# Patient Record
Sex: Female | Born: 1955 | Race: White | Hispanic: No | Marital: Married | Smoking: Never smoker
Health system: Southern US, Community
[De-identification: ages and names within clinical notes are randomized; demographics above are authoritative.]

## PROBLEM LIST (undated history)

## (undated) DIAGNOSIS — N200 Calculus of kidney: Secondary | ICD-10-CM

## (undated) DIAGNOSIS — Z5189 Encounter for other specified aftercare: Secondary | ICD-10-CM

## (undated) DIAGNOSIS — N6019 Diffuse cystic mastopathy of unspecified breast: Secondary | ICD-10-CM

## (undated) DIAGNOSIS — S7401XA Injury of sciatic nerve at hip and thigh level, right leg, initial encounter: Secondary | ICD-10-CM

## (undated) DIAGNOSIS — E559 Vitamin D deficiency, unspecified: Secondary | ICD-10-CM

## (undated) HISTORY — DX: Injury of sciatic nerve at hip and thigh level, right leg, initial encounter: S74.01XA

## (undated) HISTORY — DX: Calculus of kidney: N20.0

## (undated) HISTORY — PX: BREAST BIOPSY: SHX20

## (undated) HISTORY — DX: Encounter for other specified aftercare: Z51.89

## (undated) HISTORY — PX: OTHER SURGICAL HISTORY: SHX169

## (undated) HISTORY — DX: Vitamin D deficiency, unspecified: E55.9

## (undated) HISTORY — PX: ORIF FINGER FRACTURE: SHX2122

## (undated) HISTORY — DX: Diffuse cystic mastopathy of unspecified breast: N60.19

---
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
## 1996-11-07 DIAGNOSIS — N6019 Diffuse cystic mastopathy of unspecified breast: Secondary | ICD-10-CM

## 1996-11-07 HISTORY — DX: Diffuse cystic mastopathy of unspecified breast: N60.19

## 1998-02-04 ENCOUNTER — Other Ambulatory Visit: Admission: RE | Admit: 1998-02-04 | Discharge: 1998-02-04 | Payer: Self-pay | Admitting: *Deleted

## 1998-12-23 ENCOUNTER — Encounter: Admission: RE | Admit: 1998-12-23 | Discharge: 1998-12-23 | Payer: Self-pay | Admitting: Family Medicine

## 1998-12-23 ENCOUNTER — Encounter: Payer: Self-pay | Admitting: Family Medicine

## 1999-02-10 ENCOUNTER — Other Ambulatory Visit: Admission: RE | Admit: 1999-02-10 | Discharge: 1999-02-10 | Payer: Self-pay | Admitting: *Deleted

## 1999-05-26 ENCOUNTER — Encounter: Admission: RE | Admit: 1999-05-26 | Discharge: 1999-05-26 | Payer: Self-pay | Admitting: Family Medicine

## 1999-05-26 ENCOUNTER — Encounter: Payer: Self-pay | Admitting: Family Medicine

## 2000-02-11 ENCOUNTER — Other Ambulatory Visit: Admission: RE | Admit: 2000-02-11 | Discharge: 2000-02-11 | Payer: Self-pay | Admitting: *Deleted

## 2000-05-30 ENCOUNTER — Encounter: Payer: Self-pay | Admitting: *Deleted

## 2000-05-30 ENCOUNTER — Encounter: Admission: RE | Admit: 2000-05-30 | Discharge: 2000-05-30 | Payer: Self-pay | Admitting: *Deleted

## 2001-12-13 ENCOUNTER — Encounter: Payer: Self-pay | Admitting: Family Medicine

## 2001-12-13 ENCOUNTER — Encounter: Admission: RE | Admit: 2001-12-13 | Discharge: 2001-12-13 | Payer: Self-pay | Admitting: Family Medicine

## 2002-02-11 ENCOUNTER — Other Ambulatory Visit: Admission: RE | Admit: 2002-02-11 | Discharge: 2002-02-11 | Payer: Self-pay | Admitting: *Deleted

## 2003-02-13 ENCOUNTER — Other Ambulatory Visit: Admission: RE | Admit: 2003-02-13 | Discharge: 2003-02-13 | Payer: Self-pay | Admitting: Obstetrics and Gynecology

## 2003-08-19 ENCOUNTER — Encounter: Admission: RE | Admit: 2003-08-19 | Discharge: 2003-08-19 | Payer: Self-pay | Admitting: *Deleted

## 2004-03-19 ENCOUNTER — Ambulatory Visit: Payer: Self-pay | Admitting: Sports Medicine

## 2004-09-15 ENCOUNTER — Other Ambulatory Visit: Admission: RE | Admit: 2004-09-15 | Discharge: 2004-09-15 | Payer: Self-pay | Admitting: *Deleted

## 2004-09-29 ENCOUNTER — Encounter: Admission: RE | Admit: 2004-09-29 | Discharge: 2004-09-29 | Payer: Self-pay | Admitting: *Deleted

## 2005-10-24 ENCOUNTER — Encounter: Admission: RE | Admit: 2005-10-24 | Discharge: 2005-10-24 | Payer: Self-pay | Admitting: Obstetrics and Gynecology

## 2005-10-27 ENCOUNTER — Other Ambulatory Visit: Admission: RE | Admit: 2005-10-27 | Discharge: 2005-10-27 | Payer: Self-pay | Admitting: Obstetrics and Gynecology

## 2006-03-26 ENCOUNTER — Encounter (INDEPENDENT_AMBULATORY_CARE_PROVIDER_SITE_OTHER): Payer: Self-pay | Admitting: Specialist

## 2006-03-26 ENCOUNTER — Emergency Department (HOSPITAL_COMMUNITY): Admission: EM | Admit: 2006-03-26 | Discharge: 2006-03-26 | Payer: Self-pay | Admitting: Emergency Medicine

## 2006-12-08 ENCOUNTER — Other Ambulatory Visit: Admission: RE | Admit: 2006-12-08 | Discharge: 2006-12-08 | Payer: Self-pay | Admitting: Obstetrics & Gynecology

## 2007-01-02 ENCOUNTER — Encounter: Admission: RE | Admit: 2007-01-02 | Discharge: 2007-01-02 | Payer: Self-pay | Admitting: Family Medicine

## 2007-01-09 ENCOUNTER — Encounter: Admission: RE | Admit: 2007-01-09 | Discharge: 2007-01-09 | Payer: Self-pay | Admitting: Family Medicine

## 2007-07-11 ENCOUNTER — Encounter: Admission: RE | Admit: 2007-07-11 | Discharge: 2007-07-11 | Payer: Self-pay | Admitting: Family Medicine

## 2007-12-18 ENCOUNTER — Other Ambulatory Visit: Admission: RE | Admit: 2007-12-18 | Discharge: 2007-12-18 | Payer: Self-pay | Admitting: Obstetrics and Gynecology

## 2008-01-21 ENCOUNTER — Encounter: Admission: RE | Admit: 2008-01-21 | Discharge: 2008-01-21 | Payer: Self-pay | Admitting: Obstetrics and Gynecology

## 2008-01-21 LAB — HM DEXA SCAN

## 2008-04-23 ENCOUNTER — Ambulatory Visit: Payer: Self-pay | Admitting: Sports Medicine

## 2008-04-23 DIAGNOSIS — M25519 Pain in unspecified shoulder: Secondary | ICD-10-CM | POA: Insufficient documentation

## 2008-04-23 DIAGNOSIS — M25569 Pain in unspecified knee: Secondary | ICD-10-CM

## 2008-09-22 ENCOUNTER — Encounter: Admission: RE | Admit: 2008-09-22 | Discharge: 2008-09-22 | Payer: Self-pay | Admitting: Family Medicine

## 2008-09-24 ENCOUNTER — Ambulatory Visit (HOSPITAL_BASED_OUTPATIENT_CLINIC_OR_DEPARTMENT_OTHER): Admission: RE | Admit: 2008-09-24 | Discharge: 2008-09-24 | Payer: Self-pay | Admitting: Urology

## 2008-12-08 DIAGNOSIS — N200 Calculus of kidney: Secondary | ICD-10-CM

## 2008-12-08 HISTORY — PX: LITHOTRIPSY: SUR834

## 2008-12-08 HISTORY — DX: Calculus of kidney: N20.0

## 2008-12-25 ENCOUNTER — Ambulatory Visit (HOSPITAL_COMMUNITY): Admission: RE | Admit: 2008-12-25 | Discharge: 2008-12-25 | Payer: Self-pay | Admitting: Urology

## 2009-03-31 ENCOUNTER — Encounter: Admission: RE | Admit: 2009-03-31 | Discharge: 2009-03-31 | Payer: Self-pay | Admitting: Obstetrics and Gynecology

## 2009-11-17 ENCOUNTER — Encounter: Admission: RE | Admit: 2009-11-17 | Discharge: 2009-11-17 | Payer: Self-pay | Admitting: Sports Medicine

## 2009-11-17 ENCOUNTER — Ambulatory Visit: Payer: Self-pay | Admitting: Sports Medicine

## 2009-11-17 DIAGNOSIS — M542 Cervicalgia: Secondary | ICD-10-CM

## 2009-11-17 DIAGNOSIS — M479 Spondylosis, unspecified: Secondary | ICD-10-CM | POA: Insufficient documentation

## 2009-12-01 ENCOUNTER — Ambulatory Visit: Payer: Self-pay | Admitting: Sports Medicine

## 2009-12-29 ENCOUNTER — Ambulatory Visit: Payer: Self-pay | Admitting: Sports Medicine

## 2010-02-09 ENCOUNTER — Ambulatory Visit
Admission: RE | Admit: 2010-02-09 | Discharge: 2010-02-09 | Payer: Self-pay | Source: Home / Self Care | Attending: Sports Medicine | Admitting: Sports Medicine

## 2010-02-09 DIAGNOSIS — J019 Acute sinusitis, unspecified: Secondary | ICD-10-CM | POA: Insufficient documentation

## 2010-02-28 ENCOUNTER — Encounter: Payer: Self-pay | Admitting: Family Medicine

## 2010-03-11 NOTE — Assessment & Plan Note (Signed)
Summary: UPPER R SIDED BACK PAIN X 3 WKS   Vital Signs:  Patient profile:   55 year old female Height:      68 inches Weight:      165 pounds BMI:     25.18 BP sitting:   125 / 83  Vitals Entered By: Lillia Pauls CMA (November 17, 2009 2:05 PM)  History of Present Illness: 6 wks ago she was helping daughter moving and hanging up window dressing pain started in upper RT trap personal trainer thought stress f 2 weddings was doing boxing activity and worse saw joel tull w pain in both traps and massage helped immediately and then wore off as soon as she drove 2 .5 wks ago  now seeing There[facility], DC 3 treatements w ART this has helped pain in RT arm but still feels very heavy Pattern is radiation to thumb and index finger  Allergies (verified): No Known Drug Allergies  Physical Exam  General:  Well-developed,well-nourished,in no acute distress; alert,appropriate and cooperative throughout examination Msk:  diminished reflex in RT arm at BR and triceps sensory change C5/6  no weakness in testing C5 to T1  left arm is wnl as are reflexes  neck motion pain w extension slt pain w rt rotation and Rt lat bend flexion only pain at extreme   Impression & Recommendations:  Problem # 1:  NECK PAIN (ICD-723.1)  Her updated medication list for this problem includes:    Diclofenac Sodium 50 Mg Tbec (Diclofenac sodium) .Marland Kitchen... 1 by mouth two times a day with food    Tramadol Hcl 50 Mg Tabs (Tramadol hcl) .Marland Kitchen... 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 1 by mouth two times a day with food 2)  Ambien 10 Mg Tabs (Zolpidem tartrate) .... 1/2 tab qhs 3)  Bupropion Hcl 150 Mg Xr12h-tab (Bupropion hcl) .Marland Kitchen.. 1 by mouth bid 4)  Adderall Xr 10 Mg Xr24h-cap (Amphetamine-dextroamphetamine) .Marland Kitchen.. 1 by mouth qd 5)  Tramadol Hcl 50 Mg Tabs (Tramadol hcl) .Marland Kitchen.. 1 by mouth qid 6)  Gabapentin 300 Mg Caps (Gabapentin) .Marland Kitchen.. 1 by mouth tid  Patient Instructions: 1)  My thoughts are 95% probablility this is a cervical disk most likely at C5/6 2)  Neck 3)  easy motion exercises 4)  easy isometric resistance 3 exercises to back 5)  cont icing if getting relief 6)  posture is important 7)  ART if working this is OK to do but you cannot have increased sxs into arm 8)  medications 9)  tramadol is a non narcotic pain medicine 10)  major side effects are dizziness, constipation, and for some people wakes you up at night 11)  gabapentin is a nerve blocker 12)  major sideffects are drowsiness, feeling drunk, sometimes mood changes or nightmares 13)  tramadol use at least two times a day but up to 4 times 14)  gabapentin start just at night for first 3  days then two times a day 3 days and then three times a day 15)  RTC in about 10 days Prescriptions: GABAPENTIN 300 MG CAPS (GABAPENTIN) 1 by mouth tid  #90 x 3   Entered by:   Enid Baas MD   Authorized by:   Darene Lamer MD   Signed by:   Enid Baas MD on 11/17/2009   Method used:   [facility]        [facility] [address] [phone]* (retail)       [address]       [place], [place]  [address]       Ph: [phone] or [phone]       Fax: [phone]   RxID:   [id number] TRAMADOL HCL 50 MG TABS (TRAMADOL HCL) 1 by mouth qid  #120 x 3   Entered by:   Enid Baas MD   Authorized by:   Darene Lamer MD   Signed by:   Enid Baas MD on 11/17/2009    Method used:   [facility]        [facility] [address] [phone]* (retail)       [address]       [place], [place]  [address]       Ph: [phone] or [phone]       Fax: [phone]   RxID:   [id number]

## 2010-03-11 NOTE — Assessment & Plan Note (Signed)
Summary: F/U,MC   Vital Signs:  Patient profile:   55 year old female Pulse rate:   67 / minute BP sitting:   129 / 79  (right arm)  Vitals Entered By: Rochele Pages RN (December 29, 2009 12:05 PM) CC: f/u rt shoulder pain arm numbness, and finger tingling   CC:  f/u rt shoulder pain arm numbness and and finger tingling.  History of Present Illness: 55 yo female seen last month for neck pain. Pt states exercises have helped a lot over time and is doing well but feels she has plateau some in her improvement the last couple weeks.  XR showed moderate DJD with C5/C6 disc space narrowing and prominent spurring. Taking Gabapentin 300 three times a day. Tramadol for pain prn. Symptoms go down R arm iand cause numbness in the right first three finger from time to time. Still with subjective decre strength in R arm. She has been doing ART with [facility], DC that helps.    No bowel/bladder problems.  Current Medications (verified): 1)  Diclofenac Sodium 50 Mg Tbec (Diclofenac Sodium) .Marland Kitchen.. 1 By Mouth Two Times A Day With Food 2)  Ambien 10 Mg Tabs (Zolpidem Tartrate) .... 1/2 Tab Qhs 3)  Bupropion Hcl 150 Mg Xr12h-Tab (Bupropion Hcl) .Marland Kitchen.. 1 By Mouth Bid 4)  Adderall Xr 10 Mg Xr24h-Cap (Amphetamine-Dextroamphetamine) .Marland Kitchen.. 1 By Mouth Qd 5)  Tramadol Hcl 50 Mg Tabs (Tramadol Hcl) .Marland Kitchen.. 1 By Mouth Qid 6)  Gabapentin 300 Mg Caps (Gabapentin) .Marland Kitchen.. 1 By Mouth Tid  Allergies (verified): No Known Drug Allergies  Physical Exam  General:  Well-developed,well-nourished,in no acute distress; alert,appropriate and cooperative throughout examination Msk:  Upper ext: Reflexes 2+ to Biceps, brachioradialis, and triceps bilaterally.   Sensory intact distally. No thenar or hypothenar atrophy. Strength 5/5 to arm abduction and ER. negative Neer, hawkins and empty can  Neck: No pain through ROM with mild decrease in flexion actively only Neg spurlings No paraspinal spasm to palpation Pulses:   2+ Extremities:  no edema   Impression & Recommendations:  Problem # 1:  NECK PAIN (ICD-723.1) Seems significantly improved since last visit with increase in ROM and strength in right arm.  Gave pt some more exercises to help with ROM and now some strength improvement. Encouraged to see There[facility] Distance if helping.  Continue neurontin Continue tramadol as needed  Pt has voltaren as well told not to mix with other NSAIDs f/u in 6 weeks.   Her updated medication list for this problem includes:    Diclofenac Sodium 50 Mg Tbec (Diclofenac sodium) .Marland Kitchen... 1 by mouth two times a day with food    Tramadol Hcl 50 Mg Tabs (Tramadol hcl) .Marland Kitchen... 1 by mouth qid  Problem # 2:  SHOULDER PAIN, BILATERAL (ICD-719.41) see above.  Much improved, continue exercises and given some more to help with posture.  See again in 6 weeks if needed.  Her updated medication list for this problem includes:    Diclofenac Sodium 50 Mg Tbec (Diclofenac sodium) .Marland Kitchen... 1 by mouth two times a day with food    Tramadol Hcl 50 Mg Tabs (Tramadol hcl) .Marland Kitchen... 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 1 by mouth two times a day with food 2)  Ambien  10 Mg Tabs (Zolpidem tartrate) .... 1/2 tab qhs 3)  Bupropion Hcl 150 Mg Xr12h-tab (Bupropion hcl) .Marland Kitchen.. 1 by mouth bid 4)  Adderall Xr 10 Mg Xr24h-cap (Amphetamine-dextroamphetamine) .Marland Kitchen.. 1 by mouth qd 5)  Tramadol Hcl 50 Mg Tabs (Tramadol hcl) .Marland Kitchen.. 1 by mouth qid 6)  Gabapentin 300 Mg Caps (Gabapentin) .Marland Kitchen.. 1 by mouth tid   Orders Added: 1)  Est. Patient Level IV [16109]

## 2010-03-11 NOTE — Assessment & Plan Note (Signed)
Summary: F/U,MC   Vital Signs:  Patient profile:   55 year old female BP sitting:   136 / 91  Vitals Entered By: Lillia Pauls CMA (February 09, 2010 11:04 AM)  CC:  shoulder follow up.  History of Present Illness: Neck- improved, continuing exercises regularly, feels much stronger shoulder- tingling almost completely gone, improved strength and ROM now when she on the computer may feel tired in her arms but otherwise good.   Taking gabapentin 2 tabs three times a day  and two times a day for tramadol.  WOuld like to stop tramadol. stopped vytorin 10 days ago.   Back pain- started last night, concerned becasue several previous kidney stones.  no pain with urination, no blood in urine. However, remembers lifting a heavy log yesterday.  Has not tried anything for this pain.  cold symptoms- taking sudafed and mucinex x 10 days. congestion and cough, productive, post nasal drip.  Headache is gone.  no fevers.  yellow mucous.  double sickening, was better last friday now worse again  Current Medications (verified): 1)  Diclofenac Sodium 50 Mg Tbec (Diclofenac Sodium) .Marland Kitchen.. 1 By Mouth Two Times A Day With Food 2)  Ambien 10 Mg Tabs (Zolpidem Tartrate) .... 1/2 Tab Qhs 3)  Bupropion Hcl 150 Mg Xr12h-Tab (Bupropion Hcl) .Marland Kitchen.. 1 By Mouth Bid 4)  Adderall Xr 10 Mg Xr24h-Cap (Amphetamine-Dextroamphetamine) .Marland Kitchen.. 1 By Mouth Qd 5)  Tramadol Hcl 50 Mg Tabs (Tramadol Hcl) .Marland Kitchen.. 1 By Mouth Qid 6)  Gabapentin 300 Mg Caps (Gabapentin) .Marland Kitchen.. 1 By Mouth Tid  Allergies (verified): No Known Drug Allergies  Review of Systems       The patient complains of prolonged cough.  The patient denies chest pain, syncope, and headaches.    Physical Exam  General:  NAD, well-developed, well-nourished, and well-hydrated.   Nose:  no external erythema and nasal dischargemucosal pallor.  no sins tenderness.   turbinate swelling and erythema bilaterally Mouth:  clear oropharynx. Lungs:  rhonchi bilaterally no  crackles Msk:  Upper ext: Sensory intact distally. No thenar or hypothenar atrophy. Strength 5/5 to arm abduction and ER. 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  Problem # 2:  SHOULDER PAIN, BILATERAL (ICD-719.41) Assessment: Improved no pain currently.  will start to wean tramadol and gabapentin as directed in pt instructions.  RTC in 2-3 months  Her updated medication list for this problem includes:    Diclofenac Sodium 50 Mg Tbec (Diclofenac sodium) .Marland Kitchen... 1 by mouth two times a day with food    Tramadol Hcl 50 Mg Tabs (Tramadol hcl) .Marland Kitchen... 1 by mouth q day  Problem # 3:  SINUSITIS, ACUTE (ICD-461.9) Assessment: New gave amoxicillin.  advised pt to return to PCP if not improved with this mdication. Her updated medication list for this problem includes:    Amoxicillin 500 Mg Caps (Amoxicillin) .Marland Kitchen... Take three times a day for 10 days  Complete Medication List: 1)  Diclofenac Sodium 50 Mg Tbec (Diclofenac sodium) .Marland Kitchen.. 1 by mouth two times a day with food 2)  Ambien 10 Mg Tabs (Zolpidem tartrate) .... 1/2 tab qhs 3)  Bupropion Hcl 150 Mg Xr12h-tab (Bupropion hcl) .Marland Kitchen.. 1 by mouth bid 4)  Adderall Xr 10 Mg Xr24h-cap (Amphetamine-dextroamphetamine) .Marland Kitchen.. 1 by mouth qd 5)  Tramadol Hcl 50 Mg Tabs (Tramadol hcl) .Marland Kitchen.. 1 by mouth q day 6)  Gabapentin 300 Mg Caps (Gabapentin) .... Take as directed 7)  Amoxicillin 500 Mg Caps (Amoxicillin) .... Take three times a day for 10 days  Patient Instructions: 1)  Cut tramadol to once daily 2)  OK to use diclofenac as neede 3)  After 1 week start weaining gabapentin as follows: 4)  600/300 /600 5 days 5)  600/ 600 5 days 6)  300/ 600 7 days 7)  600 at night 7 days 8)  then cut to 300 at night and leave  it until recheck 9)  OK to drop tramadol at this point if no pain Prescriptions: AMOXICILLIN 500 MG CAPS (AMOXICILLIN) take three times a day for 10 days  #30 x 0   Entered and Authorized by:   Enid Baas MD   Signed by:   Enid Baas MD on 02/09/2010   Method used:   [facility] to        [facility] [address] [phone]* (retail)       [address]       [place], [place]  [address]       Ph: [phone] or [phone]       Fax: [phone]   RxID:   [id number] DICLOFENAC SODIUM 50 MG TBEC (DICLOFENAC SODIUM) 1 by mouth two times a day with food  #60 x 3   Entered and Authorized by:   Enid Baas MD   Signed by:   Enid Baas MD on 02/09/2010   Method used:   [facility] to        [facility] [address] [phone]* (retail)       [address]       [place], [place]  [address]       Ph: [phone] or [phone]       Fax: [phone]   RxID:   [id number] GABAPENTIN 300 MG CAPS (GABAPENTIN) take as directed  #90 x 3   Entered and Authorized by:   Enid Baas MD   Signed by:   Enid Baas MD on 02/09/2010   Method used:   Electronically to        [facility] [address] [phone]* (retail)       [address]       [place], [place]  [address]       Ph: [phone] or [phone]       Fax: [phone]   RxID:   [id number] TRAMADOL HCL 50 MG TABS (TRAMADOL HCL) 1 by mouth q day  #30 x 3   Entered and Authorized by:   Enid Baas MD   Signed by:   Enid Baas MD on 02/09/2010   Method used:   Electronically to        [facility] [address] [phone]* (retail)       [address]       [place], [place]  [address]       Ph: [phone] or [phone]       Fax: [phone]   RxID:   [id number]    Orders Added: 1)  Est. Patient Level IV [90240]

## 2010-03-11 NOTE — Assessment & Plan Note (Signed)
Summary: F/U,MC   Vital Signs:  Patient profile:   55 year old female Pulse rate:   77 / minute BP sitting:   127 / 92  Vitals Entered By: Rochele Pages RN (December 01, 2009 11:59 AM) CC: f/u rt shoulder pain, rt arm numbness and tingling   CC:  f/u rt shoulder pain and rt arm numbness and tingling.  History of Present Illness: 55 yo female seen earlier this month for neck pain.  XR showed moderate DJD with C5/C6 disc space narrowing and prominent spurring. Taking Gabapentin 300 three times a day. Tramadol for pain. Symptoms go down R arm in a c5/c6 distribution, worse with neck flexion and extremes of extension.  This is much improved with the neurontin.  Still with subjective decre strength in R arm. Tramadol helps pain significantly. She has been doing ART with There[facility] Distance, DC that helps.    No bowel/bladder problems.  Current Medications (verified): 1)  Diclofenac Sodium 50 Mg Tbec (Diclofenac Sodium) .Marland Kitchen.. 1 By Mouth Two Times A Day With Food 2)  Ambien 10 Mg Tabs (Zolpidem Tartrate) .... 1/2 Tab Qhs 3)  Bupropion Hcl 150 Mg Xr12h-Tab (Bupropion Hcl) .Marland Kitchen.. 1 By Mouth Bid 4)  Adderall Xr 10 Mg Xr24h-Cap (Amphetamine-Dextroamphetamine) .Marland Kitchen.. 1 By Mouth Qd 5)  Tramadol Hcl 50 Mg Tabs (Tramadol Hcl) .Marland Kitchen.. 1 By Mouth Qid 6)  Gabapentin 300 Mg Caps (Gabapentin) .Marland Kitchen.. 1 By Mouth Tid  Allergies (verified): No Known Drug Allergies  Review of Systems       See HPI  Physical Exam  General:  Well-developed,well-nourished,in no acute distress; alert,appropriate and cooperative throughout examination Msk:  Upper ext: Reflexes 2+ to Biceps, brachioradialis, and triceps bilaterally.   Sensory intact distally. No thenar or hypothenar atrophy. Strength 5/5 to arm abduction and ER.  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 Range of motion exercises, avoid extreme neck flexion/extension Increase gabapentin to 300, 300, 600mg . Pt may adjust dose to find what works best to a max daily dose of 3,600mg . Continue tramadol as needed for pain. No benefit steroids at this point. RTC 4 weeks.  Complete Medication List: 1)  Diclofenac Sodium 50 Mg Tbec (Diclofenac sodium) .Marland Kitchen.. 1 by mouth two times a day with food 2)  Ambien 10 Mg Tabs (Zolpidem tartrate) .... 1/2 tab qhs 3)  Bupropion Hcl 150 Mg Xr12h-tab (Bupropion hcl) .Marland Kitchen.. 1 by mouth bid 4)  Adderall Xr 10 Mg Xr24h-cap (Amphetamine-dextroamphetamine) .Marland Kitchen.. 1 by mouth qd 5)  Tramadol Hcl 50 Mg Tabs (Tramadol hcl) .Marland Kitchen.. 1 by mouth qid 6)  Gabapentin 300 Mg Caps (Gabapentin) .Marland Kitchen.. 1 by mouth tid  Patient Instructions: 1)  Great to meet you today, 2)  Glad to see that you are progressing so well. 3)  Do range of motion exercises as we discussed, avoide extreme extending of your neck. 4)  Increase gabapentin (neurontin) to 300, 300, 600mg .  May play with the dose yourself to find what works best for you, the max daily dose is 3,600mg . 5)  Continue your tramadol as needed for pain. 6)  Come back to see [place] in 4 weeks. 7)  Enjoy the wedding and be sure to relax (if thats even possible during a wedding). 8)  -Dr. Benjamin Stain   Orders Added: 1)  Est. Patient Level III [91478]

## 2010-04-05 ENCOUNTER — Other Ambulatory Visit: Payer: Self-pay | Admitting: Obstetrics & Gynecology

## 2010-04-05 DIAGNOSIS — Z1231 Encounter for screening mammogram for malignant neoplasm of breast: Secondary | ICD-10-CM

## 2010-04-07 ENCOUNTER — Ambulatory Visit
Admission: RE | Admit: 2010-04-07 | Discharge: 2010-04-07 | Disposition: A | Discharge status: Home / Self Care | Payer: BC Managed Care – HMO | Source: Ambulatory Visit | Attending: Obstetrics & Gynecology | Admitting: Obstetrics & Gynecology

## 2010-04-07 DIAGNOSIS — Z1231 Encounter for screening mammogram for malignant neoplasm of breast: Secondary | ICD-10-CM

## 2010-05-10 ENCOUNTER — Ambulatory Visit (INDEPENDENT_AMBULATORY_CARE_PROVIDER_SITE_OTHER): Payer: BC Managed Care – HMO | Admitting: Sports Medicine

## 2010-05-10 DIAGNOSIS — M479 Spondylosis, unspecified: Secondary | ICD-10-CM

## 2010-05-10 NOTE — Patient Instructions (Signed)
Follow up as needed  I am glad that you are doing better. Give the trainer a call to get set up for an exercise program  It was great to meet you today!

## 2010-05-11 NOTE — Progress Notes (Signed)
  Subjective:    Patient ID: April Silva, female    DOB: [birth year], 55 y.o.   MRN: [id number]  HPI 55 yo female seen multiple times for right arm numbness and tingling and pain related to moderate DJD with C5/C6 disc space narrowing and prominent spurring. Pain and numbness have completely resolved, patient denies weakness. No longer taking Gabapentin or Tramadol. Occasionally taking Voltaren for knee pain. Symptoms radiated down arm in c5/c6 distribution, and were worse with neck flexion and extremes of extension.    Review of Systems As per HPI     Objective:   Physical Exam Msk:  Upper ext: Reflexes 2+ to Biceps, brachioradialis, and triceps bilaterally.   Sensory intact distally. No thenar or hypothenar atrophy. Strength 5/5 to arm abduction and ER. Finger adduction 5/5   Neck: No pain through ROM Neg spurlings No paraspinal spasm to palpation       Assessment & Plan:

## 2010-05-11 NOTE — Assessment & Plan Note (Addendum)
Neuropathy appears to have resolved. Advised to follow up as needed. OK to start exercise program with trainer

## 2010-05-12 LAB — COMPREHENSIVE METABOLIC PANEL
ALT: 39 U/L — ABNORMAL HIGH (ref 0–35)
AST: 25 U/L (ref 0–37)
Albumin: 4.2 g/dL (ref 3.5–5.2)
Alkaline Phosphatase: 88 U/L (ref 39–117)
BUN: 13 mg/dL (ref 6–23)
GFR calc non Af Amer: >60 mL/min (ref >60)
Glucose, Bld: 104 mg/dL — ABNORMAL HIGH (ref 70–99)
Sodium: 142 mEq/L (ref 135–145)
Total Bilirubin: 1 mg/dL (ref 0.3–1.2)
Total Protein: 7.2 g/dL (ref 6.0–8.3)

## 2010-05-12 LAB — CBC
Hemoglobin: 13.6 g/dL (ref 12.0–15.0)
MCV: 88.5 fL (ref 78.0–100.0)

## 2010-05-15 LAB — BASIC METABOLIC PANEL
BUN: 13 mg/dL (ref 6–23)
Creatinine, Ser: 1.37 mg/dL — ABNORMAL HIGH (ref 0.4–1.2)
GFR calc Af Amer: 49 mL/min — ABNORMAL LOW (ref >60)
Potassium: 4 mEq/L (ref 3.5–5.1)
Sodium: 142 mEq/L (ref 135–145)

## 2010-05-15 LAB — URINALYSIS, ROUTINE W REFLEX MICROSCOPIC
Bilirubin Urine: NEGATIVE
Glucose, UA: NEGATIVE mg/dL
Hgb urine dipstick: NEGATIVE
Ketones, ur: NEGATIVE mg/dL
Specific Gravity, Urine: 1.019 (ref 1.005–1.030)

## 2010-05-15 LAB — CBC: Hemoglobin: 11.3 g/dL — ABNORMAL LOW (ref 12.0–15.0)

## 2010-05-15 LAB — URINE MICROSCOPIC-ADD ON

## 2010-05-26 ENCOUNTER — Other Ambulatory Visit: Payer: Self-pay | Admitting: Sports Medicine

## 2010-06-22 NOTE — Op Note (Signed)
NAMEABENI, FINCHUM                 ACCOUNT NO.:  [id number]   MEDICAL RECORD NO.:  [id number]          PATIENT TYPE:  AMB   LOCATION:  [facility]                         FACILITY:  [facility]   PHYSICIAN:  Courtney Paris, M.D.DATE OF BIRTH:  10-22-1955   DATE OF PROCEDURE:  09/24/2008  DATE OF DISCHARGE:                               OPERATIVE REPORT   PREOPERATIVE DIAGNOSIS:  Right distal ureteral calculus with  obstruction.   POSTOPERATIVE DIAGNOSIS:  Right distal ureteral calculus with  obstruction.   OPERATIONS:  Cystoscopy, right retrograde pyelogram, right ureteroscopy  with stone extraction, right ureteral stent insertion.   ANESTHESIA:  General.   SURGEON:  Courtney Paris, M.D.   BRIEF HISTORY:  This 55 year old white female presents with her first  stone.  She had right lower quadrant pain 3-4 days ago.  CT scan showed  a distal right ureteral stone with high-grade hydronephrosis.  KUB shows  the stone still present and the obstruction still present from the  injection of the contrast media from almost 4 days ago.  The stone  measures 6 mm.  She enters now for ureteroscopy and stone extraction.   DESCRIPTION OF PROCEDURE:  The patient was placed on the operating table  in dorsal lithotomy position.  After satisfactory induction of general  anesthesia, was given IV Cipro.  Time-out was then performed and the  patient and procedure were then reconfirmed.  Panendoscope was inserted.  The bladder inspected, found to be free of any mucosal lesions.  Both  orifices looked normal.  A 5 open-ended ureteral catheter was inserted  into the right ureteral orifice and an occlusive retrograde was done.   Retrograde demonstrated still high-grade obstruction with a tortuous  ureter with obstruction down to near the UV junction on the right by the  filling defect which corresponded to the 6 mm stone seen on CT scan.  Under fluoroscopy I was able to bypass the stone with the  guidewire up  to the level of the kidney and get this into the kidney proper.  I  removed the open-ended catheter and then removed the cystoscope.  I  gently dilated the distal ureter with a ureteral access sheath under  fluoroscopy for 5 timed minutes.  This was removed and the 6 French  ureteroscope was then passed into the distal right ureter up to and past  the stone which was entrapped in a stone basket and removed intact.  No  other stones were seen.   I back loaded the guidewire over the cystoscope and inserted a 4.7 x 26  cm length double-J ureteral stent under fluoroscopy.  Removing the  guidewire, there was a nice coil in the renal pelvis and one in the  bladder.  The bladder was drained, B and O suppository given, and some  IV Toradol.  The patient was taken recovery room in good condition to be  later discharged as an outpatient.  I will remove the stent in the  office by cystoscopy next week.      Courtney Paris, M.D.  Electronically Signed  HMK/MEDQ  D:  09/24/2008  T:  09/24/2008  Job:  [id number]

## 2010-06-25 NOTE — Consult Note (Signed)
NAME:  April Silva, April [name]:  [id number]   MEDICAL RECORD NO.:  [id number]          PATIENT TYPE:  EMS   LOCATION:  [facility]                         FACILITY:  [facility]   PHYSICIAN:  Hermelinda Medicus, M.D.   DATE OF BIRTH:  [birth year]   DATE OF CONSULTATION:  DATE OF DISCHARGE:                                 CONSULTATION   This patient is a 55 year old female who [place] the beach and apparently  picked up a small sand bur from her carpet, put it between her lips for  a short time as she was going to do something else and then throw this  in the basket.  However, she aspirated this and has had a very sore  feeling in her larynx.  Her voice was slightly changed.  She had no  airway issues.  This happened at 11 o'clock this morning and she came to  the emergency room approximately 5:30 this evening.  Her voice was  stable, though was a little bit hoarse, but had no airway.  She actually  swallowed a little bit right after that, but she said it did not change  the way it felt.  She felt an area of just irritation right at the  midline around the thyroid cartilage.  On examination in the emergency  room, on indirect exam after using Cetacaine anesthesia indirect  laryngoscopy, we could see the sand spur sticking in the laryngeal  surface of the epiglottis, right just superior to the anterior  commissure.  The airway was not impaired.  I could see down her trachea.  Both pure forms were clear.  The swelling was minimal and, therefore, we  decided considering there would be no way we could find this if it moved  into her bronchus.  We would immediately take her to the operating room  and try to remove this.   PAST MEDICAL HISTORY:  NO ALLERGIES MEDICATIONS.   She takes:  1. Wellbutrin 150 twice a day.  2. Adderall XR 10   PAST SURGICAL HISTORY:  1. She walked through a plate glass window, had a sciatic nerve      laceration on the right side and had an abdominal laceration  on the      left when she was 55 years old.  2. She also has had C-sections x3.   She, otherwise, is in excellent health with no diabetes, TB, heart  trouble, blood pressure issues or respiratory issues.   PHYSICAL EXAMINATION:  VITAL SIGNS:  Blood pressure 167/96.  Pulse 84.  Her O2 was 100.  Respirations 16.  HEENT:  Her ears are clear.  Oral cavity is completely clear.  Her nose  is clear of any inflammation.  The oral cavity is clear.  The larynx  shows the bur sitting in the laryngeal surface of the epiglottis at the  anterior commissure.  Two vocal cords move well.  There is a slight  amount of erythema.  The trachea, that I could see on indirect  laryngoscopy, is clear of any lesion.  The pure forms are clear.  The  ventricles are clear.  The lateral pharyngeal walls are completely  clear.  The neck is free of any thyromegaly, cervical adenopathy or  mass.  No salivary gland abnormalities.  The teeth and gums are  unremarkable.  No thyromegaly, cervical adenopathy or mass.  CHEST:  Clear.  No rales, rhonchi or wheezes.  CARDIOVASCULAR:  No murmurs, rubs or gallops.  ABDOMEN:  Unremarkable, except for the previous injuries.  EXTREMITIES:  Unremarkable, again except for the previous injuries.   INITIAL DIAGNOSES:  1. A sand spur in the supraglottic larynx, anterior commissure      attached to the epiglottis laryngeal surface.  2. Laceration of right sciatic nerve and left abdomen secondary to      walking through a plate glass window.  3. History of C-sections.  4. History of using Wellbutrin 150 twice a day and Adderall 10.           ______________________________  Hermelinda Medicus, M.D.     JC/MEDQ  D:  03/26/2006  T:  03/27/2006  Job:  [id number]   cc:   Kristine Garbe. Ezzard Standing, M.D.

## 2010-06-25 NOTE — Op Note (Signed)
[name] Kitchen  April, [name]:  [id number]   MEDICAL RECORD NO.:  [id number]          PATIENT TYPE:  EMS   LOCATION:  [facility]                         FACILITY:  [facility]   PHYSICIAN:  Hermelinda Medicus, M.D.   DATE OF BIRTH:  [birth year]   DATE OF PROCEDURE:  DATE OF DISCHARGE:                               OPERATIVE REPORT   PREOPERATIVE DIAGNOSIS:  A sand spur in the supraglottic larynx at the  anterior commissioner wedged on the laryngeal surface of the epiglottis.   POSTOPERATIVE DIAGNOSIS:  A sand spur in the supraglottic larynx at the  anterior commissioner wedged on the laryngeal surface of the epiglottis.   PROCEDURE PERFORMED:  Direct laryngoscopy and removal of foreign body,  the sand spur.   ANESTHESIA:  Guadalupe Maple, M.D.   SURGEON:  Hermelinda Medicus, M.D.   INFORMED CONSENT:  The patient is aware of the risks and the plan and  the gains.   DESCRIPTION OF PROCEDURE:  The patient was placed in the supine position  and, under general mask anesthesia and in an effort not to take any  chances on displacing this after she was under mask anesthesia and well  oxygenated, we used an anterior commissioner laryngoscope and the small  laryngeal biopsy forceps and were able to carefully move the scope in to  see the anterior commissioner region and were able to remove this  without displacing this.  Of greatest concern was, of course, the  possibility of its moving and having great difficulty in finding it in a  bronchus.  But we were able to remove this in toto and the laryngeal  surface of the epiglottis looked to be in excellent condition.  There  was no evidence of any edema or bleeding.  There was no blood loss, and  the true vocal cords also looked to be in good condition.  The  subglottic region looked excellent.  The pyriforms and the ventricles  all looked clear.  The scope was then removed.  The patient was  continued on her mask and then was awakened and  tolerated the procedure  very well and is doing well postoperatively.   Follow-up will be in two days.  She will be given Decadron 10 and  continue on antibiotics using a cephalosporin and will be followed,  then, in 10 days and in three weeks.  She will be kept as an outpatient.           ______________________________  Hermelinda Medicus, M.D.     JC/MEDQ  D:  03/26/2006  T:  03/27/2006  Job:  [id number]   cc:   Doug Sou, M.D.  Kristine Garbe. Ezzard Standing, M.D.

## 2010-09-13 ENCOUNTER — Ambulatory Visit (INDEPENDENT_AMBULATORY_CARE_PROVIDER_SITE_OTHER): Payer: BC Managed Care – HMO | Admitting: Sports Medicine

## 2010-09-13 ENCOUNTER — Ambulatory Visit: Payer: BC Managed Care – HMO | Admitting: Sports Medicine

## 2010-09-13 VITALS — BP 154/97

## 2010-09-13 DIAGNOSIS — M542 Cervicalgia: Secondary | ICD-10-CM

## 2010-09-13 DIAGNOSIS — M479 Spondylosis, unspecified: Secondary | ICD-10-CM

## 2010-09-13 MED ORDER — TRAMADOL HCL 50 MG PO TABS: 50.0000 mg | ORAL_TABLET | Freq: Four times a day (QID) | ORAL | Status: AC | PRN

## 2010-09-13 MED ORDER — GABAPENTIN 300 MG PO CAPS: 300.0000 mg | ORAL_CAPSULE | Freq: Three times a day (TID) | ORAL | Status: DC

## 2010-09-13 NOTE — Assessment & Plan Note (Signed)
She has a recurrent flare of her cervical disc disease but I think was triggered by overhead activity and trying to scrape windows.  Because of her significant radicular symptoms I think she needs to start back on gabapentin and may need to stay on this at least at a low dose  We will also work with her to use exercises and good posture  Recheck in 4-6 weeks

## 2010-09-13 NOTE — Assessment & Plan Note (Signed)
With her persistent pain I think she should start back on the tramadol  She will use this 2-4 times a day as needed for pain control  She can cut down on her use of Voltaren but can add some odd days if the pain gets too significant

## 2010-09-13 NOTE — Patient Instructions (Signed)
Do standing chest flies Rowing motion at shoulder height and waist level Arm shake outs Range of motion for neck Work on posture Follow up in 1 month  Gabapentin instructions- Take 1 by mouth at bedtime for the first 5 days, then 1 during the day and 1 at bedtime for the next 5 days, and then 2 during the day and 1 at bedtime.  Continue on gabapentin 3 times per day. If gabapentin is working well- you may stop Remus Loffler  If tramadol is working well- you may not need to take the diclofenac.

## 2010-09-13 NOTE — Progress Notes (Signed)
  Subjective:    Patient ID: April Silva, female    DOB: 1956-01-27, 55 y.o.   MRN: [id number]  HPI  Pt presents to clinic for evaluation of neck pain that started soon after last visit in April.  She thinks the pain started due to lifting and moving things around her house.  Now has shooting pain into upper rt arm and elbow pain, feels weaker using thumb and 1st finger for pinch or elbow for activity.  Also, has tingling in 1st finger and thumb.  Feels better if she sleeps on lt side with rt arm in ER.  Has an area of trapezius that she can apply pressure to which relieves neck pain.    Siyah has done very well from her treatment for degenerative cervical disc disease last year and had weaned off all of her medications. For this reason she is only taking Voltaren for the pain and has not found much relief with this.  Review of Systems     Objective:   Physical Exam No acute distress  Standing with 3-4 degrees of head tilted to rt  Rt trapezius 2 cm higher than lt Spasm in rt trapezius with palpation Lateral lean of neck is ok, but tight Posture is good with a few degrees of head forward Extension and flexion good Rt scapular winging, does not increase with abduction and elevation  Good pinch grip on rt  Testing C5-T1 reveals normal strength Full rt elbow motion, with no pain on palpation Empty can negative Hawkins negative Triceps reflex 1+ on rt, 2+ on lt Biceps reflex slightly decreased on rt, compared to lt          Assessment & Plan:

## 2010-09-20 ENCOUNTER — Other Ambulatory Visit: Payer: Self-pay | Admitting: Family Medicine

## 2010-09-20 ENCOUNTER — Ambulatory Visit
Admission: RE | Admit: 2010-09-20 | Discharge: 2010-09-20 | Disposition: A | Discharge status: Home / Self Care | Payer: BC Managed Care – HMO | Source: Ambulatory Visit | Attending: Family Medicine | Admitting: Family Medicine

## 2010-09-20 DIAGNOSIS — W19XXXA Unspecified fall, initial encounter: Secondary | ICD-10-CM

## 2010-09-20 IMAGING — CR DG HAND COMPLETE 3+V*L*
3 series · 3 of 3 positions shown · non-contrast
Comparison: None

CLINICAL DATA: . Left hand injury and pain

LEFT HAND - COMPLETE 3+ VIEW

[x hand pa left]
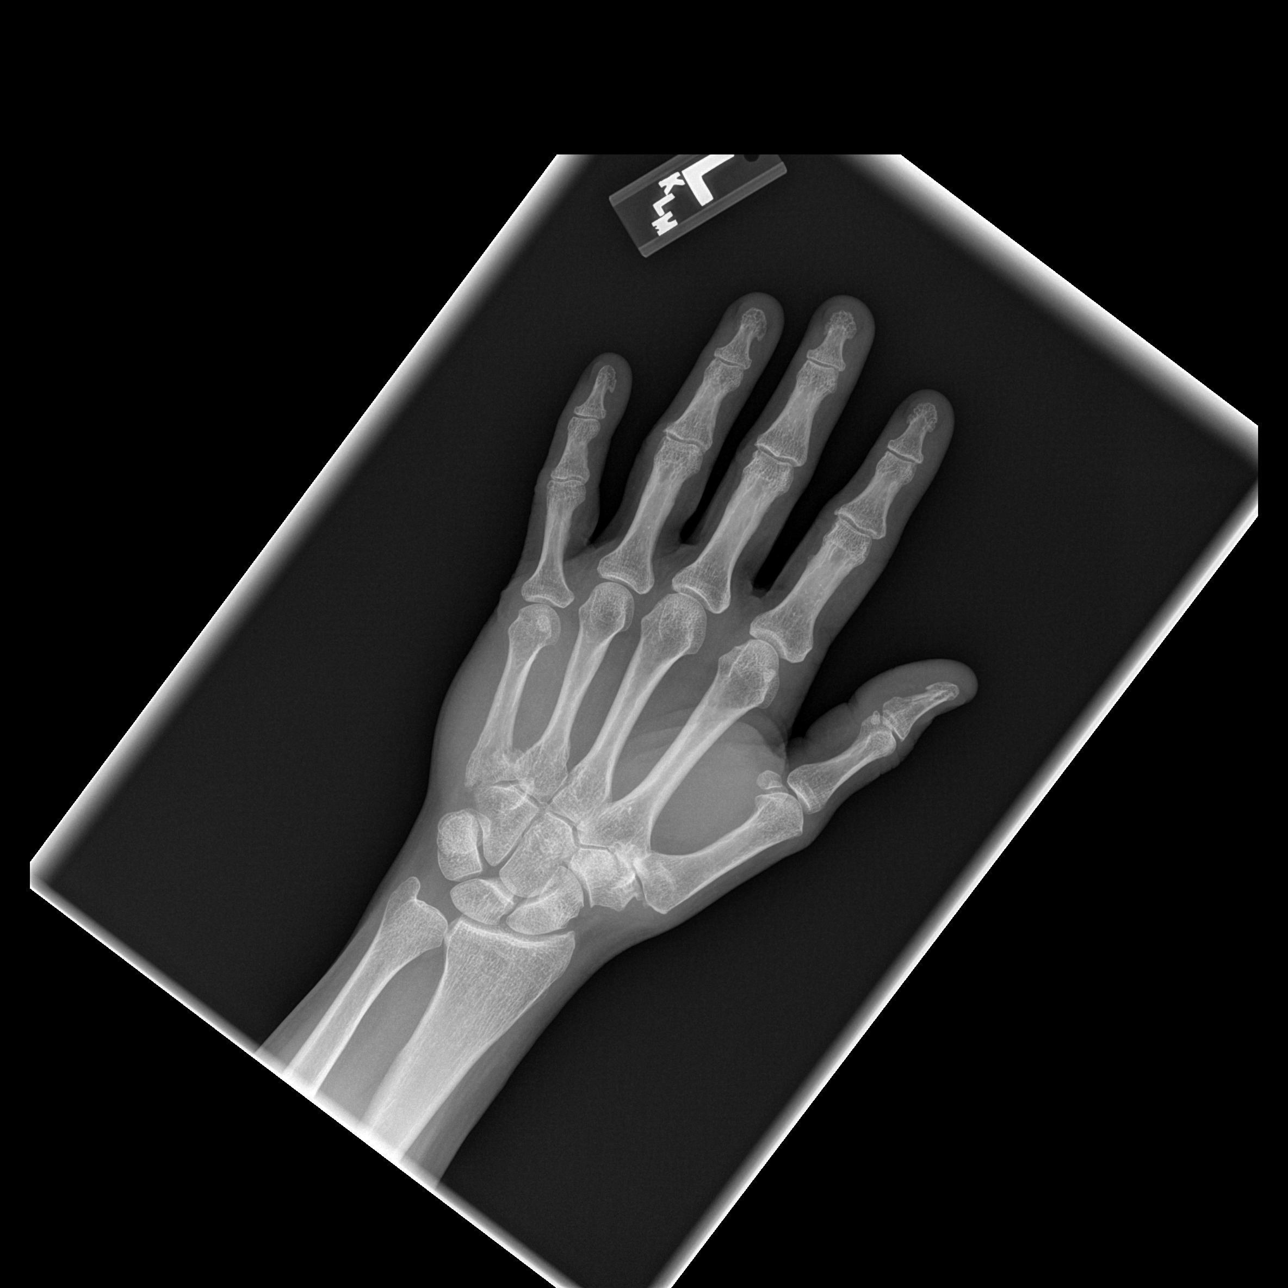

[x hand oblique left]
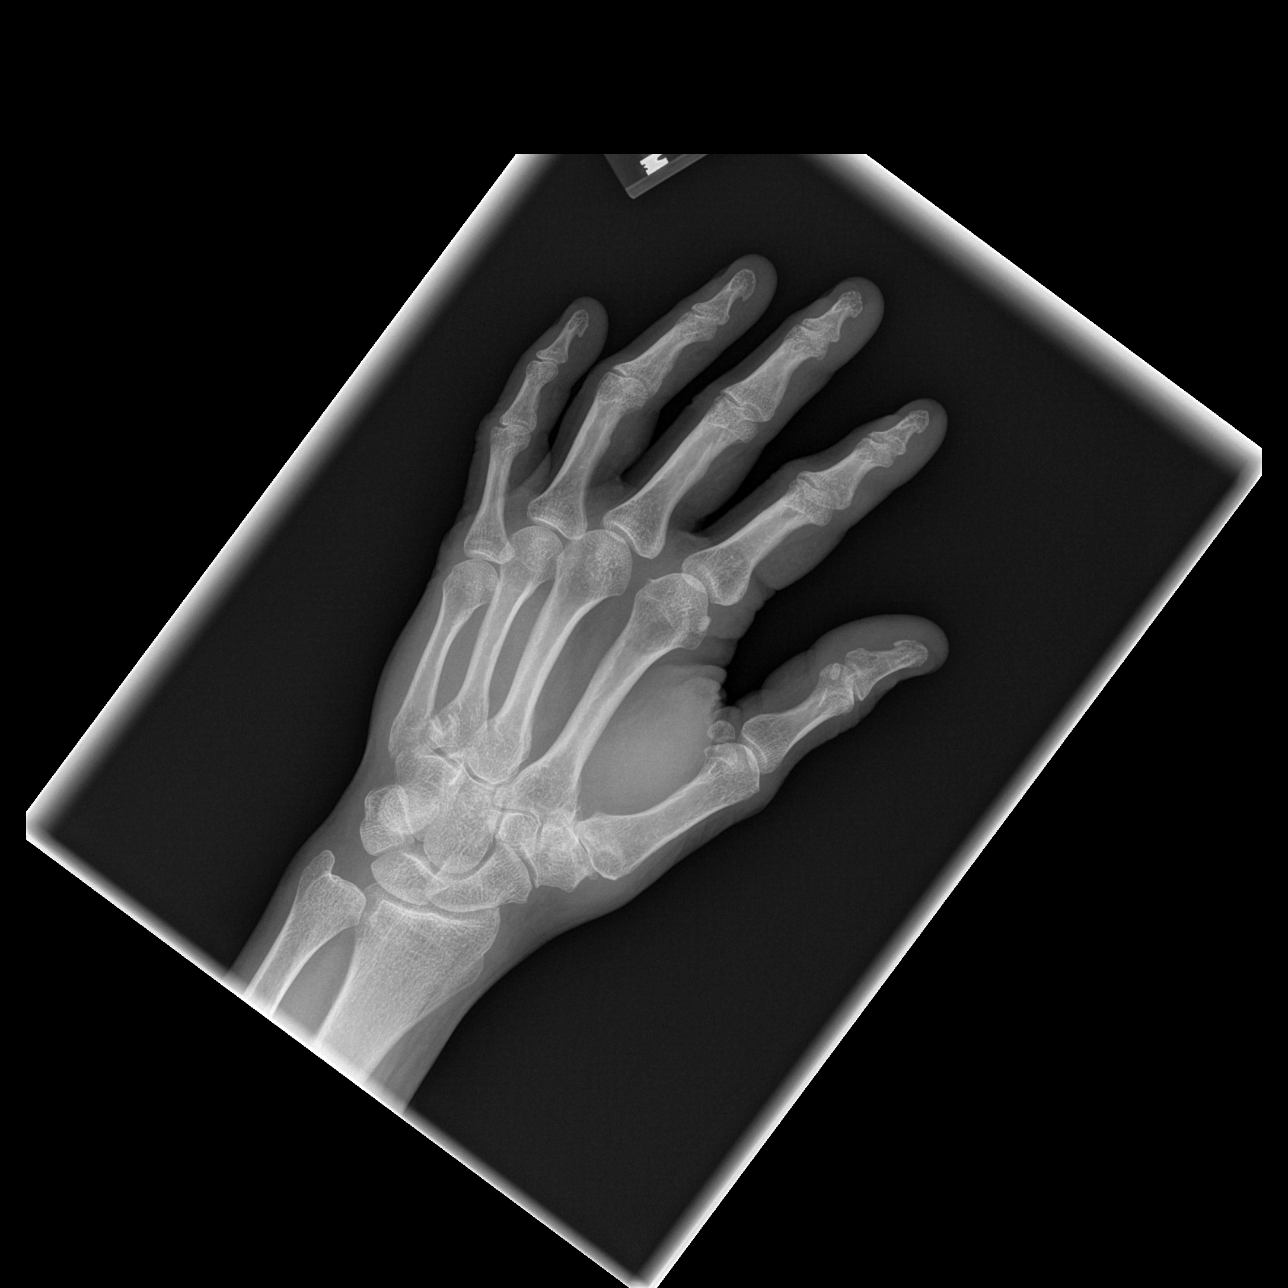

[x hand lat left]
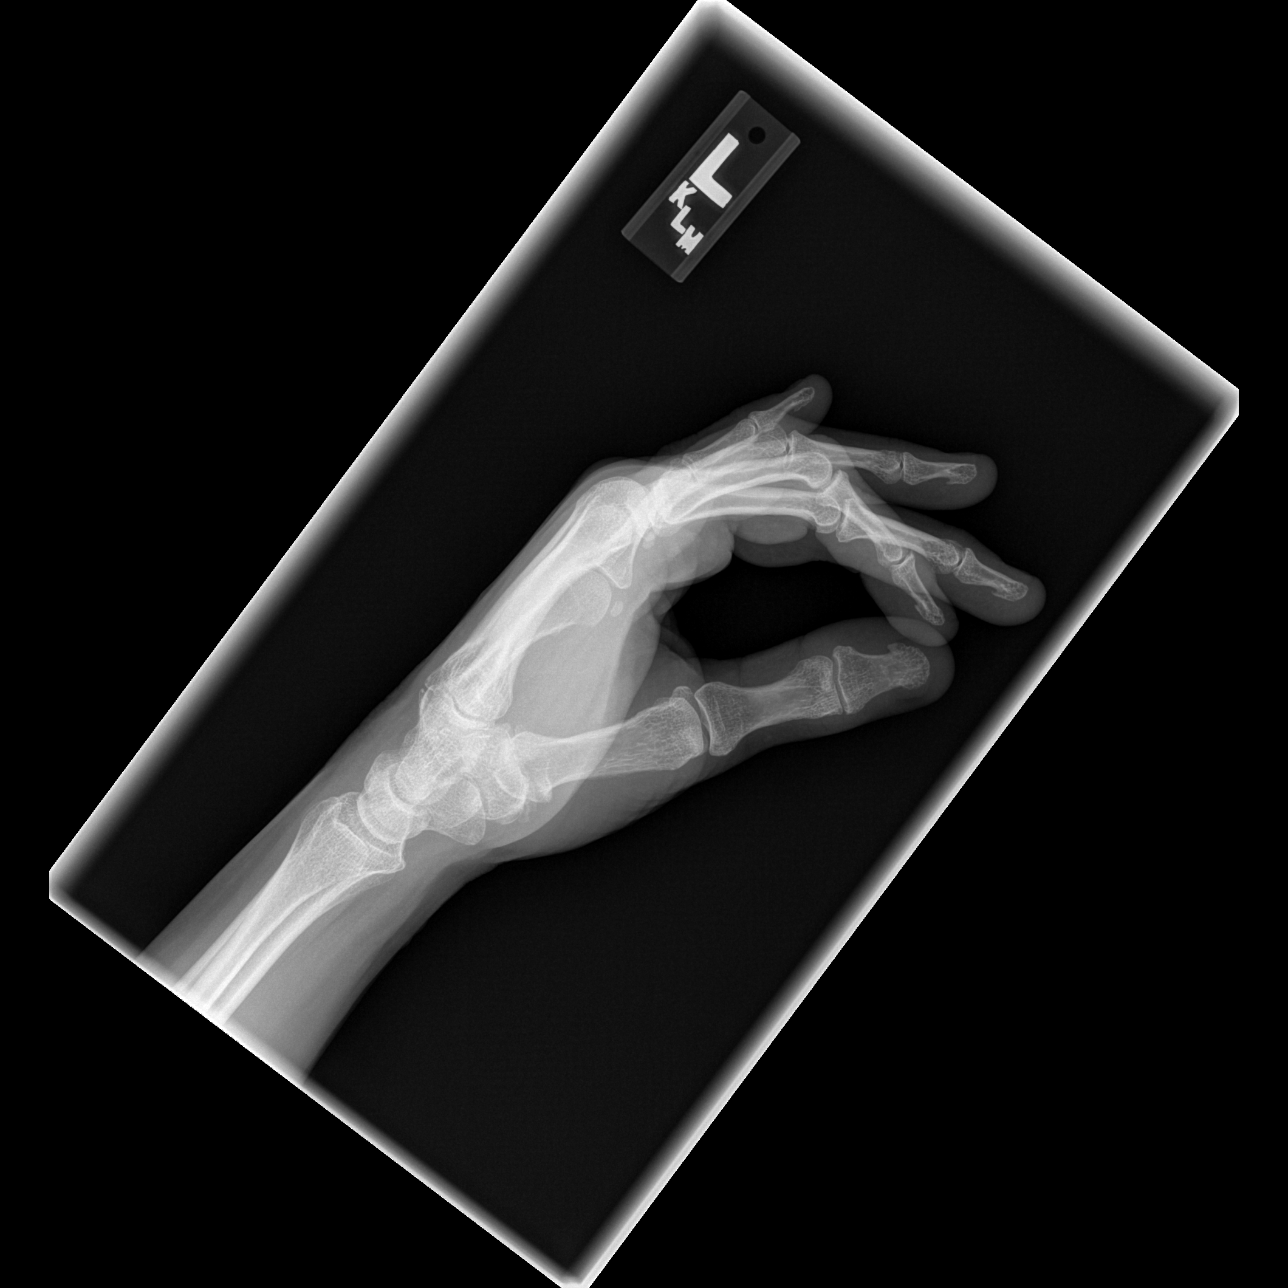

[3 of 3 positions shown; findings below may reference images not displayed]

FINDINGS: An oblique fracture at the base of the fifth metacarpal
is noted with 3-4 mm medial displacement.  This fracture appears to
extend to the carpal/metacarpal joint.
There is no evidence of subluxation or dislocation.
Mild degenerative changes at the first carpometacarpal joint noted.
No other focal bony abnormalities are present.
IMPRESSION: Mildly displaced oblique fracture at the fifth metacarpal base with
possible intra-articular extension.

## 2010-10-12 ENCOUNTER — Ambulatory Visit (INDEPENDENT_AMBULATORY_CARE_PROVIDER_SITE_OTHER): Payer: BC Managed Care – HMO | Admitting: Sports Medicine

## 2010-10-12 ENCOUNTER — Encounter: Payer: Self-pay | Admitting: Sports Medicine

## 2010-10-12 VITALS — BP 127/79 | HR 84 | Ht 68.0 in | Wt 170.0 lb

## 2010-10-12 DIAGNOSIS — M21371 Foot drop, right foot: Secondary | ICD-10-CM | POA: Insufficient documentation

## 2010-10-12 DIAGNOSIS — S62329A Displaced fracture of shaft of unspecified metacarpal bone, initial encounter for closed fracture: Secondary | ICD-10-CM | POA: Insufficient documentation

## 2010-10-12 DIAGNOSIS — M479 Spondylosis, unspecified: Secondary | ICD-10-CM

## 2010-10-12 DIAGNOSIS — M216X9 Other acquired deformities of unspecified foot: Secondary | ICD-10-CM

## 2010-10-12 NOTE — Assessment & Plan Note (Signed)
Appears that she had a transection of the dorsal cutaneous ner br of lat sup peroneal n  Will need to strengthen eversion  Given series of exercises  Also strengthen the post tib funtion to help with better arch and push off Reck 6 wks

## 2010-10-12 NOTE — Patient Instructions (Addendum)
I would like for you to take the gabapentin, 1 in the morning and 2 at bedtime.  Also please take the tramadol three times per day, but do not take after 6pm as it may keep you up at night.   Follow up in 6 weeks.

## 2010-10-12 NOTE — Progress Notes (Signed)
  Subjective:    Patient ID: April Silva, female    DOB: [birth year], 55 y.o.   MRN: [id number]  HPI Here for f/u of neck/shoulder pain.  Mild improvement of pain since last visit.  Did recently fracture 5th metacarpal of L hand so she is having to use her R arm more which is causing her to have more pain.  Describes two types of pain.  First type of pain is in upper back/shoulder along trapezius that is a dull constant ache.  Second type of pain are "electric buzzes" that she feels intermittently down her arm.  Feels like her strength is ok.  Is taking gabapentin but often forgets or misses doses.  Was taking tramadol in the past but currently not taking because she was using oxycodone in from her recent hand surgery.    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  States she ran through a plate glass door as a kid and cut the lower part of her leg.  Since that time has had mild foot drop and difficulty with lifting toes 3-5.   Review of Systems     Objective:   Physical Exam  R Shoulder/Neck: Spasm in rt trapezius with palpation  FROM in neck. Posture is good with a few degrees of head forward  Good pinch grip on rt  Testing C5-T1 reveals normal strength  Full rt elbow motion, with no pain on palpation  Empty can negative  Hawkins negative  Triceps reflex 1+ on rt, 2+ on lt  Biceps reflex slightly decreased on rt, compared to lt  Gait/R leg Difficulty with dorsiflexion of toes 3-5.   Does exhibit mild foot drop with walking, better while in heeled shoes.            Assessment & Plan:

## 2010-10-12 NOTE — Assessment & Plan Note (Signed)
Cont care per hand surgeon

## 2010-10-12 NOTE — Assessment & Plan Note (Signed)
Increase use of tramadol now that off the oxycodone for fx  Use 2 gabapentin at hs and 1 in AM  Work posture and ROM

## 2010-10-14 ENCOUNTER — Ambulatory Visit: Payer: BC Managed Care – HMO | Admitting: Sports Medicine

## 2011-01-03 ENCOUNTER — Other Ambulatory Visit: Payer: Self-pay | Admitting: Sports Medicine

## 2011-01-03 ENCOUNTER — Other Ambulatory Visit: Payer: Self-pay | Admitting: *Deleted

## 2011-01-03 MED ORDER — GABAPENTIN 300 MG PO CAPS: ORAL_CAPSULE | ORAL | Status: DC

## 2011-01-17 ENCOUNTER — Ambulatory Visit (INDEPENDENT_AMBULATORY_CARE_PROVIDER_SITE_OTHER): Payer: BC Managed Care – HMO | Admitting: Sports Medicine

## 2011-01-17 VITALS — BP 120/80

## 2011-01-17 DIAGNOSIS — M479 Spondylosis, unspecified: Secondary | ICD-10-CM

## 2011-01-17 NOTE — Patient Instructions (Signed)
1. Do the stretching exercises that we demonstrated for you when you feel tightness.  2. You can use a tennis ball to release the shoulder muscles with massage.  3. Take your gabapentin in the following manner:       Take one in the am, one at lunch, and 2 at bedtime for one week. Then...      Take one in the am, two at lunch, and 2 at bedtime for one week.  Then...      Take two in the am, two at lunch, and 2 at bedtime until we see you again  4. Follow up in 6 weeks.

## 2011-01-19 NOTE — Progress Notes (Signed)
  Subjective:    Patient ID: April Silva, female    DOB: 1956-01-30, 55 y.o.   MRN: [id number]  HPI 55 y/o female is here to follow up for neck pain and right sided radiculopathy secondary to known cervical spine DJD with impingement.  She is on gabapentin 300 TID and has had an improvement of her symptoms.  She no longer has pain radiating down her arm.  She does have pain in the posterior shoulder bilaterally.  Note this is her second flare of DDD with radiation from her cervical spine over past year   Review of Systems     Objective:   Physical Exam NAD  Tenderness to palpation over the traps and supraspinatus right greater than left No tenderness to palpation of the cervical spine Normal ROM of the cervical spine without pain, she does feel some tightness with lateral rotation Upper extremity strength is normal bilaterally ROM at the shoulder is normal bilaterally Upper extremity sensation is normal bilaterally        Assessment & Plan:

## 2011-01-19 NOTE — Assessment & Plan Note (Addendum)
Her symptoms are improved.  We will titrate to the full dose of gabapentin.  Demonstrated some stretching exercises to help with the spasm she is currently having.  She can also use a tennis ball for massage of problem areas.

## 2011-02-03 ENCOUNTER — Other Ambulatory Visit: Payer: Self-pay | Admitting: *Deleted

## 2011-02-03 MED ORDER — GABAPENTIN 300 MG PO CAPS: 600.0000 mg | ORAL_CAPSULE | Freq: Three times a day (TID) | ORAL | Status: DC

## 2011-02-18 ENCOUNTER — Ambulatory Visit: Payer: BC Managed Care – HMO | Admitting: Family Medicine

## 2011-02-28 ENCOUNTER — Ambulatory Visit: Payer: BC Managed Care – HMO | Admitting: Sports Medicine

## 2011-03-03 ENCOUNTER — Ambulatory Visit (INDEPENDENT_AMBULATORY_CARE_PROVIDER_SITE_OTHER): Payer: BC Managed Care – HMO | Admitting: Sports Medicine

## 2011-03-03 VITALS — BP 126/84

## 2011-03-03 DIAGNOSIS — M479 Spondylosis, unspecified: Secondary | ICD-10-CM

## 2011-03-03 NOTE — Progress Notes (Signed)
  Subjective:    Patient ID: April Silva, female    DOB: 1955/03/12, 56 y.o.   MRN: [id number]  HPI  Pt presents to clinic for f/u of cervical DDD with radicular symptoms. Discouraged because she has not improved as much as she had at last visit. Taking gabapentin 600 mg TID, tramadol once daily. Has a new muscle tightness posterior rt shoulder that goes up into neck and causes severe headaches. Gets numbness in 1-2 fingers on rt, some numbness in this hand.   She does do a fair amount of computer work which may worsen her symptoms. She also has been doing a fair amount of moving some furniture and that clearly does worsen her symptoms.  Continues to see [facility] for ART.    Review of Systems     Objective:   Physical Exam No acute distress  Preserved reflexes bilat elbows Good strength c5-t1 distribution Neck motion- limited on lt, not on rt   Full flexion and extension, lateral bend good bilat of neck trapezius spasm to palpation on rt   After PNF - trap spasm breaks- and gets normal motion to left       Assessment & Plan:

## 2011-03-03 NOTE — Assessment & Plan Note (Signed)
I encouraged her to use a bit more tramadol as I think we have her on enough gabapentin  Start some specific exercises to try to break the trapezius spasm by using some resisted motion of the neck in a left lateral flexion position  Stay with the gabapentin and tramadol for 2 more months  Realize that this is likely to be a chronic conditions we need to find ways to lessen symptoms and prevent flares  Recheck in 2 months

## 2011-03-03 NOTE — Patient Instructions (Addendum)
Increase tramadol 3 times per day for the next 2 weeks- ok to take with gabpentin Ok to decrease to 2 times per day after this, but do take at least 2 tramadol per day  Add shake out exercises for shoulder spasms Lean head to lt- push against it for 3 times for 10 seconds, then pull down against 3 times for 10 seconds-  Take 2-3 deep breaths in between   Please follow up in 2 months  Thank you for seeing [place] today!

## 2011-04-20 ENCOUNTER — Other Ambulatory Visit: Payer: Self-pay | Admitting: Family Medicine

## 2011-04-20 DIAGNOSIS — Z1231 Encounter for screening mammogram for malignant neoplasm of breast: Secondary | ICD-10-CM

## 2011-04-28 ENCOUNTER — Ambulatory Visit
Admission: RE | Admit: 2011-04-28 | Discharge: 2011-04-28 | Disposition: A | Discharge status: Home / Self Care | Payer: BC Managed Care – PPO | Source: Ambulatory Visit | Attending: Family Medicine | Admitting: Family Medicine

## 2011-04-28 DIAGNOSIS — Z1231 Encounter for screening mammogram for malignant neoplasm of breast: Secondary | ICD-10-CM

## 2011-04-30 ENCOUNTER — Other Ambulatory Visit: Payer: Self-pay | Admitting: Sports Medicine

## 2011-05-02 ENCOUNTER — Ambulatory Visit (INDEPENDENT_AMBULATORY_CARE_PROVIDER_SITE_OTHER): Payer: BC Managed Care – PPO | Admitting: Sports Medicine

## 2011-05-02 VITALS — BP 124/82

## 2011-05-02 DIAGNOSIS — F3289 Other specified depressive episodes: Secondary | ICD-10-CM

## 2011-05-02 DIAGNOSIS — F329 Major depressive disorder, single episode, unspecified: Secondary | ICD-10-CM

## 2011-05-02 DIAGNOSIS — M479 Spondylosis, unspecified: Secondary | ICD-10-CM

## 2011-05-02 NOTE — Assessment & Plan Note (Signed)
Since her situation is better and she has had no recent issues with depression, I think it is reasonable to try to wean some medicines  First will try ambien - hoepfully gabapenitn will help sleep  Next would try Adderall because depression may have been factor in those sxs  Would leave wellbutrin as is until others weaned

## 2011-05-02 NOTE — Patient Instructions (Addendum)
For weaning April Silva- do not take the medication every 3rd night for 2 weeks, if this does not make you stay awake - ok to start taking every other day for 1 month, then decrease to only taking every 3 days for 1 month, if you tolerate this decrease ok to discontinue the ambien  Take last dose of gabapentin around 10 pm  Please follow up in 3 months  For April Silva if he wishes - Sleep apnea clinic at [facility] is good, or carmen dohmeier is a Insurance account manager with a sleep apnea clinic  Thank you for seeing [place] today!

## 2011-05-02 NOTE — Progress Notes (Signed)
  Subjective:    Patient ID: April Silva, female    DOB: 03-05-1955, 56 y.o.   MRN: [id number]  HPI  Pt presents to clinic for f/u DJD c-spine with radicular symptoms. She has started going to a posture class at [employer]. Also doing home exercises daily which help break muscle spasms.  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 Taking gabapentin 600 mg tid, tramadol bid. Does not have significant arm tingling unless she does something to stretch the nerve.   Hx of significant depression during time of financial and business stress Saw Dr Jodi Marble then Has been on Addral, well butrin and Ambien since She would like to try to wean off these   Review of Systems     Objective:   Physical Exam Pleasant, normal affect and interaction  Posture good now Full neck flexion, extension, and rotation Full lateral bend Good ER and IR of bilat shoulders Almost equal back scratch bilat Neg empty can bilat Neg hawkins bilat Slight clicking under left shoulder blade with abduction and elevation Strength and neuro testing is normal today      Assessment & Plan:

## 2011-05-02 NOTE — Assessment & Plan Note (Signed)
Clearly doing better  Keep up posture work Keep up exercises as we discussed  We will leave the gabapentin and tramadol as is for next 3 mos

## 2011-08-01 ENCOUNTER — Ambulatory Visit (INDEPENDENT_AMBULATORY_CARE_PROVIDER_SITE_OTHER): Payer: Managed Care, Other (non HMO) | Admitting: Sports Medicine

## 2011-08-01 VITALS — BP 123/79

## 2011-08-01 DIAGNOSIS — F329 Major depressive disorder, single episode, unspecified: Secondary | ICD-10-CM

## 2011-08-01 DIAGNOSIS — M479 Spondylosis, unspecified: Secondary | ICD-10-CM

## 2011-08-01 NOTE — Progress Notes (Signed)
  Subjective:    Patient ID: April Silva, female    DOB: 03/03/1955, 56 y.o.   MRN: [id number]  HPI 56 y/o female with DDD of the cervical spine is here for follow up.  Her symptoms are improving with posture training.  At her last visit the plan was to try to wean some of her medications. She has discontinued the mid day dose of her tramadol and neurontin.  She gets spasm about 3-4 times a week that resolves with stretching and relaxation.    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  She takes 5mg  qhs.   Review of Systems     Objective:   Physical Exam  Neck has good ROM Right trapezius is mildly tender to palpation with palpable spasm Otherwise there is no tenderness to palpation of the neck or shoulders Normal strength and ROM of the upper extremity      Assessment & Plan:

## 2011-08-02 ENCOUNTER — Ambulatory Visit: Payer: BC Managed Care – PPO | Admitting: Sports Medicine

## 2011-08-02 NOTE — Assessment & Plan Note (Signed)
She will not wean any further because she is having symptoms 3-4 times a week.  If her symptoms decrease to once a week we can considering discontinuing the am dose of tramadol and neurontin.  Long term we will plan to keep the pm dose of neurontin to help with pain control and sleep.

## 2011-08-02 NOTE — Assessment & Plan Note (Signed)
Her outlook has improved since her last visit.  She will continue with her ambien for sleep.  She is also taking wellbutrin.  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

## 2011-08-22 ENCOUNTER — Other Ambulatory Visit: Payer: Self-pay | Admitting: Sports Medicine

## 2012-03-07 ENCOUNTER — Ambulatory Visit (INDEPENDENT_AMBULATORY_CARE_PROVIDER_SITE_OTHER): Payer: Managed Care, Other (non HMO) | Admitting: Family Medicine

## 2012-03-07 ENCOUNTER — Encounter: Payer: Self-pay | Admitting: Family Medicine

## 2012-03-07 VITALS — BP 131/86 | HR 80 | Ht 68.0 in | Wt 171.0 lb

## 2012-03-07 DIAGNOSIS — S93699A Other sprain of unspecified foot, initial encounter: Secondary | ICD-10-CM

## 2012-03-07 DIAGNOSIS — M629 Disorder of muscle, unspecified: Secondary | ICD-10-CM

## 2012-03-07 NOTE — Patient Instructions (Addendum)
Thank you for coming in today. You have a plantar fascia rupture.  Use the arch straps and heel cups.  You can ice your heel with a pan of ice water or a frozen styrofoam cup of water.  Let us know if you need pain medicine.  Exercises: Calf raises 15 reps x2 daily Pull on toe massage.   Come back in a few weeks if you are not improving.  Bring your work out shoes.

## 2012-03-07 NOTE — Progress Notes (Signed)
April Silva is a 57 y.o. female who presents to [facility] today for left heel pain. Patient attended an aerobics class for the first time in a long time 2 days ago.  During the class she noted a painful pop in the left plantar heel.  She notes swelling and pain on the plantar medial aspect of the left heel. The pain is worse with standing and activity and better with rest.  She is currently taking diclofenac which helps a bit.  She denies any history of plantar fasciitis.  She denies any radiating pain weakness or numbness.  She feels well otherwise   PMH reviewed.   Significant for cervical degenerative disc disease. History  Substance Use Topics  . Smoking status: Never Smoker   . Smokeless tobacco: Not on file  . Alcohol Use: Not on file   social history: Her elderly aunt infirmed mother is currently living at home with her ROS as above otherwise neg   Exam:  BP 131/86  Pulse 80  Ht 5\' 8"  (1.727 m)  Wt 171 lb (77.565 kg)  BMI 26.00 kg/m2 Gen: Well NAD MSK: Left heel: Swelling over the medial plantar calcaneus.  Tender to palpation of the medial plantar calcaneus.  High appearing arches bilaterally.  Normal foot motion sensation and strength.  Normal posterior tibialis function with toe standing.    Limited musculoskeletal ultrasound of the left heel:  Plantar fascia is present with a large area of hypoechoic change at the calcaneal surface on the medial portion of the plantar fascia insertion.  The fascia is intact distally.  Mildly increased Doppler flow.   Consistent with partial plantar fascia rupture

## 2012-03-07 NOTE — Assessment & Plan Note (Signed)
Noted clinically and on musculoskeletal ultrasound.  Plan: Relative rest, ice, arch straps, heel cushions.  After one or 2 weeks of rest we'll start eccentric heel exercises, massage.  Patient will followup in 3-4 weeks if not improving.

## 2012-03-21 ENCOUNTER — Other Ambulatory Visit: Payer: Self-pay | Admitting: Family Medicine

## 2012-03-21 DIAGNOSIS — Z1231 Encounter for screening mammogram for malignant neoplasm of breast: Secondary | ICD-10-CM

## 2012-03-29 LAB — HM PAP SMEAR: HM Pap smear: NEGATIVE

## 2012-05-02 ENCOUNTER — Ambulatory Visit
Admission: RE | Admit: 2012-05-02 | Discharge: 2012-05-02 | Disposition: A | Discharge status: Home / Self Care | Payer: Managed Care, Other (non HMO) | Source: Ambulatory Visit | Attending: Family Medicine | Admitting: Family Medicine

## 2012-05-02 DIAGNOSIS — Z1231 Encounter for screening mammogram for malignant neoplasm of breast: Secondary | ICD-10-CM

## 2012-07-25 ENCOUNTER — Other Ambulatory Visit: Payer: Managed Care, Other (non HMO) | Admitting: Family Medicine

## 2012-11-01 ENCOUNTER — Ambulatory Visit: Payer: Managed Care, Other (non HMO) | Admitting: Sports Medicine

## 2012-11-05 ENCOUNTER — Ambulatory Visit (INDEPENDENT_AMBULATORY_CARE_PROVIDER_SITE_OTHER): Payer: BC Managed Care – HMO | Admitting: Sports Medicine

## 2012-11-05 VITALS — BP 121/85 | Ht 68.0 in | Wt 173.0 lb

## 2012-11-05 DIAGNOSIS — M25572 Pain in left ankle and joints of left foot: Secondary | ICD-10-CM

## 2012-11-05 DIAGNOSIS — M25579 Pain in unspecified ankle and joints of unspecified foot: Secondary | ICD-10-CM

## 2012-11-05 DIAGNOSIS — M25511 Pain in right shoulder: Secondary | ICD-10-CM

## 2012-11-05 DIAGNOSIS — M25519 Pain in unspecified shoulder: Secondary | ICD-10-CM

## 2012-11-05 NOTE — Progress Notes (Signed)
  Subjective:    Patient ID: April Silva, female    DOB: 1955-03-29, 57 y.o.   MRN: [id number]  HPI chief complaint: Right shoulder and left foot pain  Very pleasant 57 year old female comes in today mainly for a referral to physical therapy. She has a long-standing history of cervical degenerative disc disease and has been treated by Dr. Darrick Penna in the past. She is complaining of pain in the right parascapular region. She is also getting some spasm in this area. Although she was initially getting some numbness and tingling into the right hand initially that has now resolved with past treatment. She does get some mild weakness from time to time in her right arm. In regards to her left foot she has a history of plantar fasciitis which was treated back in January of this year. Today she is experiencing pain along the lateral aspect of her foot. She denies any heel pain. Her pain is localized to the base of the fifth metatarsal but at times she will get pain along the distal fifth metatarsal as well particularly with first standing. She has some orthotics that she has been wearing. No recent trauma.  Past medical history and current medications are reviewed No known allergies    Review of Systems     Objective:   Physical Exam Well-developed, well-nourished. No acute distress. Awake alert and oriented x3.  Full painless cervical range of motion. There is tenderness to palpation along the right parascapular region but no spasm and no obvious trigger point. Neurological exam shows a 1/4 biceps reflex on the right compared to a 2/4 on the left. Brachial radialis and triceps reflexes are both 2/4 bilaterally. No atrophy. Good strength. Left foot: Cavus foot. No tenderness to palpation along the plantar fascia or at the calcaneus. Mild tenderness along the course of the peroneal tendon at its insertion onto the base of the fifth metatarsal. No soft tissue swelling. Mild pain with resisted foot eversion.  Walking without a limp.       Assessment & Plan:  Right parascapular pain likely referred pain from cervical degenerative disc disease Left foot pain secondary to lateral column overload versus peroneal tendinopathy  I agree that the patient would likely benefit from physical therapy. A referral to [facility] was made. I will have him try some iontophoresis to her lateral ankle and she will followup with me in 6 weeks. It is quite possible that her symptoms here are secondary to lateral column overload since she was previously suffering from plantar fasciitis. If her foot is still painful at followup I would consider ultrasound and we may need to tweek her orthotics just a bit. She will call with questions or concerns prior to her followup visit.

## 2012-12-19 ENCOUNTER — Encounter: Payer: Self-pay | Admitting: Sports Medicine

## 2012-12-19 ENCOUNTER — Ambulatory Visit (INDEPENDENT_AMBULATORY_CARE_PROVIDER_SITE_OTHER): Payer: BC Managed Care – HMO | Admitting: Sports Medicine

## 2012-12-19 VITALS — BP 129/84 | Ht 68.0 in | Wt 177.0 lb

## 2012-12-19 DIAGNOSIS — M25511 Pain in right shoulder: Secondary | ICD-10-CM

## 2012-12-19 DIAGNOSIS — M25519 Pain in unspecified shoulder: Secondary | ICD-10-CM

## 2012-12-19 NOTE — Progress Notes (Signed)
  Subjective:    Patient ID: April Silva, female    DOB: 1955/10/24, 57 y.o.   MRN: [id number]  HPI Patient comes in today for followup on posterior right shoulder pain. Still experiencing intermittent discomfort. She has been working with physical therapy as well as a rolfing. Seems to be improving. She denies significant pain, numbness, or tingling in the right arm. Her main concern today is her level of inactivity. She is wanting to discuss nutrition as well as a well rounded exercise routine. She is concerned that she has gained quite a bit of weight and has become more inactive and she thinks that a lot of her aches and pains are due to this.    Review of Systems     Objective:   Physical Exam Well-developed, well-nourished. No acute distress. Weight is 177 pounds  Right shoulder shows full range of motion. There is some parascapular tenderness to palpation. She has good strength in the right upper extremity but has a 1/4 biceps and 1/4 triceps reflex on the right compared to 2-3/4 on the left. No atrophy. Neurovascularly intact distally.       Assessment & Plan:  Right parascapular pain likely referred pain from the cervical spine  I think the patient should continue to work with physical therapy until she is ready to be discharged to a home exercise program. I had a long talk with her regarding nutrition and a proper exercise routine consisting of strengthening, cardio, and stretching. I provided her with a number for Riley Kill (nutritionist) and I would like to see her back in the office in 4-6 weeks.

## 2012-12-19 NOTE — Patient Instructions (Signed)
  Wyona Almas (nutritionist)- [phone]   You need 30-60 minutes of cardio 4 times a week  You need 2 days a week of strength training  You need to stretch twice a week, preferably after your cardio workout  Followup with me in 6 weeks.

## 2013-01-24 ENCOUNTER — Ambulatory Visit: Payer: BC Managed Care – HMO | Admitting: Sports Medicine

## 2013-04-03 ENCOUNTER — Encounter: Payer: Self-pay | Admitting: Nurse Practitioner

## 2013-04-04 ENCOUNTER — Ambulatory Visit: Payer: Self-pay | Admitting: Nurse Practitioner

## 2013-04-12 ENCOUNTER — Encounter: Payer: Self-pay | Admitting: Nurse Practitioner

## 2013-04-12 ENCOUNTER — Ambulatory Visit (INDEPENDENT_AMBULATORY_CARE_PROVIDER_SITE_OTHER): Payer: BC Managed Care – PPO | Admitting: Nurse Practitioner

## 2013-04-12 VITALS — BP 116/66 | HR 66 | Ht 67.0 in | Wt 176.0 lb

## 2013-04-12 DIAGNOSIS — Z780 Asymptomatic menopausal state: Secondary | ICD-10-CM

## 2013-04-12 DIAGNOSIS — Z0000 Encounter for general adult medical examination without abnormal findings: Secondary | ICD-10-CM

## 2013-04-12 DIAGNOSIS — E559 Vitamin D deficiency, unspecified: Secondary | ICD-10-CM

## 2013-04-12 DIAGNOSIS — Z1211 Encounter for screening for malignant neoplasm of colon: Secondary | ICD-10-CM

## 2013-04-12 DIAGNOSIS — Z01419 Encounter for gynecological examination (general) (routine) without abnormal findings: Secondary | ICD-10-CM

## 2013-04-12 MED ORDER — VITAMIN D (ERGOCALCIFEROL) 1.25 MG (50000 UNIT) PO CAPS: 50000.0000 [IU] | ORAL_CAPSULE | ORAL | Status: DC

## 2013-04-12 NOTE — Progress Notes (Signed)
Patient ID: April Silva, female   DOB: 03/02/1955, 58 y.o.   MRN: [id number] 58 y.o. [id number] Married Caucasian Fe here for annual exam.  No new health problems other than pain in mid back that radiates to right shoulder and axilla.   No LMP recorded. Patient is postmenopausal.          Sexually active: yes  The current method of family planning is post menopausal status.    Exercising: yes  Exercise is limited by injuries. Smoker:  no  Health Maintenance: Pap:  03/29/12, WNL, neg HR HPV MMG:  05/02/12, Bi-Rads 1: negative Colonoscopy:  12/2008, normal, recall in 10 years BMD:  12/09 0.2/-1.3 TDaP: 12/22/09 Labs:  HB:  12.8 Urine:  Negative, pH 5.0   reports that she has never smoked. She has never used smokeless tobacco. She reports that she drinks about 3.5 ounces of alcohol per week. She reports that she does not use illicit drugs.  Past Medical History  Diagnosis Date  . Vitamin D deficiency   . Fibrocystic breast 10/98  . Injury of right sciatic nerve 1971    w/laceration of artery and nerve, blood transfusion, fell through plate glass door  . Kidney stone 11/10    stent, lithotripsy  . Blood transfusion without reported diagnosis age 58    lac artery and right sciatic nerve    Past Surgical History  Procedure Laterality Date  . Lithotripsy  12/2008    Current Outpatient Prescriptions  Medication Sig Dispense Refill  . amphetamine-dextroamphetamine (ADDERALL XR) 10 MG 24 hr capsule Take 10 mg by mouth daily.      Marland Kitchen. atorvastatin (LIPITOR) 20 MG tablet Take 20 mg by mouth daily.      Marland Kitchen. buPROPion (WELLBUTRIN SR) 150 MG 12 hr tablet Take 150 mg by mouth 2 (two) times daily.       . cetirizine (ZYRTEC) 10 MG tablet Take 10 mg by mouth daily.      . diclofenac (VOLTAREN) 75 MG EC tablet Take 75 mg by mouth 2 (two) times daily.      . Misc Natural Products (LUTEIN 20 PO) Take by mouth.      . zolpidem (AMBIEN) 10 MG tablet Take 5 mg by mouth At bedtime.      . Vitamin D,  Ergocalciferol, (DRISDOL) 50000 UNITS CAPS Take 50,000 Units by mouth daily.       No current facility-administered medications for this visit.    Family History  Problem Relation Age of Onset  . Heart disease Mother   . Heart disease Father   . Diabetes Father   . Hypertension Father   . Heart attack Father   . Parkinson's disease Father   . Heart disease Maternal Grandmother   . Heart attack Maternal Grandmother   . Multiple births Paternal Aunt     had twins  . Multiple sclerosis Maternal Grandfather   . Breast cancer Paternal Grandmother   . Hypertension Paternal Grandmother   . Hypertension Paternal Grandfather     ROS:  Pertinent items are noted in HPI.  Otherwise, a comprehensive ROS was negative.  Exam:   BP 116/66  Pulse 66  Ht 5\' 7"  (1.702 m)  Wt 176 lb (79.833 kg)  BMI 27.56 kg/m2 Height: 5\' 7"  (170.2 cm)  Ht Readings from Last 3 Encounters:  04/12/13 5\' 7"  (1.702 m)  12/19/12 5\' 8"  (1.727 m)  11/05/12 5\' 8"  (1.727 m)    General appearance: alert, cooperative and appears stated  age Head: Normocephalic, without obvious abnormality, atraumatic Neck: no adenopathy, supple, symmetrical, trachea midline and thyroid normal to inspection and palpation Lungs: clear to auscultation bilaterally Breasts: normal appearance, no masses or tenderness dense without obvious mass. Heart: regular rate and rhythm Abdomen: soft, non-tender; no masses,  no organomegaly Extremities: extremities normal, atraumatic, no cyanosis or edema Skin: Skin color, texture, turgor normal. No rashes or lesions Lymph nodes: Cervical, supraclavicular, and axillary nodes normal. No abnormal inguinal nodes palpated Neurologic: Grossly normal   Pelvic: External genitalia:  no lesions              Urethra:  normal appearing urethra with no masses, tenderness or lesions              Bartholin's and Skene's: normal                 Vagina: normal appearing vagina with normal color and discharge,  no lesions              Cervix: anteverted              Pap taken: no Bimanual Exam:  Uterus:  normal size, contour, position, consistency, mobility, non-tender              Adnexa: no mass, fullness, tenderness               Rectovaginal: Confirms               Anus:  normal sphincter tone, no lesions  A:  Well Woman with normal exam  Postmenopausal  Vit D deficient    P:   Pap smear as per guidelines   Mammogram due 3/15  Order for BMD  Counseled on breast self exam, adequate intake of calcium and vitamin D, diet and exercise return annually or prn  An After Visit Summary was printed and given to the patient.

## 2013-04-12 NOTE — Patient Instructions (Signed)

## 2013-04-13 LAB — VITAMIN D 25 HYDROXY (VIT D DEFICIENCY, FRACTURES): Vit D, 25-Hydroxy: 44 ng/mL (ref 30–89)

## 2013-04-15 LAB — HEMOGLOBIN, FINGERSTICK: Hemoglobin, fingerstick: 12.8 g/dL (ref 12.0–16.0)

## 2013-04-16 ENCOUNTER — Other Ambulatory Visit: Payer: Self-pay

## 2013-04-16 DIAGNOSIS — Z1231 Encounter for screening mammogram for malignant neoplasm of breast: Secondary | ICD-10-CM

## 2013-04-16 NOTE — Progress Notes (Signed)
Encounter reviewed by Dr. Lachlan Pelto Silva.  

## 2013-04-22 LAB — POCT URINALYSIS DIPSTICK
BILIRUBIN UA: NEGATIVE
GLUCOSE UA: NEGATIVE
Ketones, UA: NEGATIVE
Leukocytes, UA: NEGATIVE
Nitrite, UA: NEGATIVE
Protein, UA: NEGATIVE
RBC UA: NEGATIVE
Urobilinogen, UA: NEGATIVE
pH, UA: 5

## 2013-04-22 LAB — FECAL OCCULT BLOOD, IMMUNOCHEMICAL: IMMUNOLOGICAL FECAL OCCULT BLOOD TEST: NEGATIVE

## 2013-05-03 ENCOUNTER — Ambulatory Visit
Admission: RE | Admit: 2013-05-03 | Discharge: 2013-05-03 | Disposition: A | Discharge status: Home / Self Care | Payer: BC Managed Care – PPO | Source: Ambulatory Visit | Attending: Nurse Practitioner | Admitting: Nurse Practitioner

## 2013-05-03 ENCOUNTER — Ambulatory Visit
Admission: RE | Admit: 2013-05-03 | Discharge: 2013-05-03 | Disposition: A | Discharge status: Home / Self Care | Payer: BC Managed Care – PPO | Source: Ambulatory Visit

## 2013-05-03 DIAGNOSIS — Z780 Asymptomatic menopausal state: Secondary | ICD-10-CM

## 2013-05-03 DIAGNOSIS — Z1231 Encounter for screening mammogram for malignant neoplasm of breast: Secondary | ICD-10-CM

## 2013-05-07 ENCOUNTER — Telehealth: Payer: Self-pay | Admitting: *Deleted

## 2013-05-07 ENCOUNTER — Other Ambulatory Visit: Payer: Self-pay | Admitting: Nurse Practitioner

## 2013-05-07 DIAGNOSIS — R928 Other abnormal and inconclusive findings on diagnostic imaging of breast: Secondary | ICD-10-CM

## 2013-05-07 NOTE — Telephone Encounter (Signed)
I have attempted to contact this patient by phone with the following results: left message to return my call on answering machine (home/mobile per DPR).  

## 2013-05-07 NOTE — Telephone Encounter (Signed)
Pt notified in result note.  

## 2013-05-07 NOTE — Telephone Encounter (Signed)
Message copied by Luisa DagoPHILLIPS, Sanae Willetts C on Tue May 07, 2013 11:48 AM ------      Message from: Ria CommentGRUBB, PATRICIA R      Created: Tue May 07, 2013  8:51 AM       Leet patient know that BMD shows a good score at the spine and upper osteopenic range at the left femur at 1.5.  Compared to previous study in 2009 there has been a decrease at the hip by 3.3 %.  This means more time with a walking program such as treadmill stair stepper or bicycle at least 3 -4 times weekly.  Still needs to do upper body weights. Recheck i 2 years. ------

## 2013-05-17 ENCOUNTER — Ambulatory Visit
Admission: RE | Admit: 2013-05-17 | Discharge: 2013-05-17 | Disposition: A | Discharge status: Home / Self Care | Payer: Self-pay | Source: Ambulatory Visit | Attending: Nurse Practitioner | Admitting: Nurse Practitioner

## 2013-05-17 ENCOUNTER — Other Ambulatory Visit: Payer: Self-pay | Admitting: Nurse Practitioner

## 2013-05-17 DIAGNOSIS — R928 Other abnormal and inconclusive findings on diagnostic imaging of breast: Secondary | ICD-10-CM

## 2013-05-17 DIAGNOSIS — R921 Mammographic calcification found on diagnostic imaging of breast: Secondary | ICD-10-CM

## 2013-05-21 ENCOUNTER — Ambulatory Visit
Admission: RE | Admit: 2013-05-21 | Discharge: 2013-05-21 | Disposition: A | Discharge status: Home / Self Care | Payer: BC Managed Care – PPO | Source: Ambulatory Visit | Attending: Nurse Practitioner | Admitting: Nurse Practitioner

## 2013-05-21 DIAGNOSIS — R928 Other abnormal and inconclusive findings on diagnostic imaging of breast: Secondary | ICD-10-CM

## 2013-05-21 DIAGNOSIS — R921 Mammographic calcification found on diagnostic imaging of breast: Secondary | ICD-10-CM

## 2013-05-28 ENCOUNTER — Inpatient Hospital Stay: Admission: RE | Admit: 2013-05-28 | Payer: BC Managed Care – PPO | Source: Ambulatory Visit

## 2013-06-18 ENCOUNTER — Ambulatory Visit (INDEPENDENT_AMBULATORY_CARE_PROVIDER_SITE_OTHER): Payer: BC Managed Care – PPO | Admitting: Sports Medicine

## 2013-06-18 ENCOUNTER — Encounter: Payer: Self-pay | Admitting: Sports Medicine

## 2013-06-18 VITALS — BP 125/89 | Ht 67.0 in | Wt 170.0 lb

## 2013-06-18 DIAGNOSIS — S93699A Other sprain of unspecified foot, initial encounter: Secondary | ICD-10-CM

## 2013-06-18 DIAGNOSIS — M629 Disorder of muscle, unspecified: Secondary | ICD-10-CM

## 2013-06-18 DIAGNOSIS — M25579 Pain in unspecified ankle and joints of unspecified foot: Secondary | ICD-10-CM | POA: Insufficient documentation

## 2013-06-18 NOTE — Assessment & Plan Note (Signed)
Use ankle compresison over left foot to reduce swelling and stablizie per brev isometiric exerc  Place latearl post on orthotics  Cont on diclofenac  Reck pending response

## 2013-06-18 NOTE — Progress Notes (Signed)
Patient ID: Blanchard KelchSally H Swatek, female   DOB: 06/13/1955, 58 y.o.   MRN: [id number]  Patient w hx of Left PF partial teat about 18 mos ago This gradually got better However she feels that she may have favored this with walking on outside of foot RT foot has mild foot drop so she works left harder  Now pain is lateral foot and into forefoot No real swelling Orthotics do help but pain is still there Barefoot is painful  PEXAM NAD BP 125/89  Ht 5\' 7"  (1.702 m)  Wt 170 lb (77.111 kg)  BMI 26.62 kg/m2  Foot has normal structure Left forefoot is 1.2 cms wider than RT Palpable tendernses over MTP plantar surface 4 and 5 Slt pain on squeeze but no nerve radaitons Pain on left at base of 5th  US Bursal type swelling below the insertion of per brevis tendon into base of 5th No sgin of stress fracture Swelling between MT heads of 4 and 5 Nerve does not show neuroma Plantar fascia appears almost back to normal w minimal change at proximal medial aspect

## 2013-06-18 NOTE — Assessment & Plan Note (Signed)
This appears to be healed clincially and almost normal on [place]

## 2013-06-18 NOTE — Patient Instructions (Signed)
You have bursitis at base of your 5th metatarsal You have this along the lateral column of foot - that hurts along 4th and 5th metatarsals I think this got too much pressure from walking after ruptured plantar fascia and RT foot is weaker Your left forefoot is 1 cm wider from stretch of ligaments  Start isometric activity - pushing against a wall with lateral Lt foot - 5 repeats x count of 5 several times a day  Stick with voltaren  Ice massage along lateral edge of foot once daily  Let's put a lateral post on left orthotic

## 2013-10-11 ENCOUNTER — Telehealth: Payer: Self-pay | Admitting: Nurse Practitioner

## 2013-10-11 NOTE — Telephone Encounter (Signed)
Sympathy card sent about mothers passing.  Daughter April Silva shared information today.

## 2013-12-09 ENCOUNTER — Encounter: Payer: Self-pay | Admitting: Sports Medicine

## 2013-12-19 ENCOUNTER — Other Ambulatory Visit: Payer: Self-pay | Admitting: Dermatology

## 2014-04-01 ENCOUNTER — Other Ambulatory Visit: Payer: Self-pay

## 2014-04-01 DIAGNOSIS — Z1231 Encounter for screening mammogram for malignant neoplasm of breast: Secondary | ICD-10-CM

## 2014-04-17 ENCOUNTER — Encounter: Payer: Self-pay | Admitting: Nurse Practitioner

## 2014-04-17 ENCOUNTER — Ambulatory Visit (INDEPENDENT_AMBULATORY_CARE_PROVIDER_SITE_OTHER): Payer: BLUE CROSS/BLUE SHIELD | Admitting: Nurse Practitioner

## 2014-04-17 VITALS — BP 124/82 | HR 68 | Ht 66.5 in | Wt 178.0 lb

## 2014-04-17 DIAGNOSIS — Z124 Encounter for screening for malignant neoplasm of cervix: Secondary | ICD-10-CM | POA: Diagnosis not present

## 2014-04-17 DIAGNOSIS — Z01419 Encounter for gynecological examination (general) (routine) without abnormal findings: Secondary | ICD-10-CM

## 2014-04-17 DIAGNOSIS — Z0000 Encounter for general adult medical examination without abnormal findings: Secondary | ICD-10-CM

## 2014-04-17 DIAGNOSIS — E559 Vitamin D deficiency, unspecified: Secondary | ICD-10-CM | POA: Diagnosis not present

## 2014-04-17 DIAGNOSIS — Z780 Asymptomatic menopausal state: Secondary | ICD-10-CM | POA: Diagnosis not present

## 2014-04-17 MED ORDER — VITAMIN D (ERGOCALCIFEROL) 1.25 MG (50000 UNIT) PO CAPS: 50000.0000 [IU] | ORAL_CAPSULE | ORAL | Status: DC

## 2014-04-17 NOTE — Progress Notes (Signed)
Patient ID: April KelchSally H Sage, female   DOB: 05/03/1955, 59 y.o.   MRN: [id number] 59 y.o. [id number] Married  Caucasian Fe here for annual exam.  Went off Adderall and finds she feels more anxious and more fatigue.    No LMP recorded. Patient is postmenopausal.          Sexually active: Yes.    The current method of family planning is post menopausal status.    Exercising: Yes.    Gym/ health club routine includes spin class 1 hour three times per week. Smoker:  no  Health Maintenance: Pap:  03/29/12, negative with neg HR HPV MMG:  05/03/13, 3D, biopsy 05/21/13, PASH; screening in one year apt in April Colonoscopy:  12/2008, normal, recall in 10 years BMD:   05/03/13, T-Score 0.4S/-1.5L TDaP:  12/22/09 Labs:  HB:  PCP  Urine:  PCP   reports that she has never smoked. She has never used smokeless tobacco. She reports that she drinks about 1.2 oz of alcohol per week. She reports that she does not use illicit drugs.  Past Medical History  Diagnosis Date  . Vitamin D deficiency   . Fibrocystic breast 10/98  . Injury of right sciatic nerve 1971    w/laceration of artery and nerve, blood transfusion, fell through plate glass door  . Kidney stone 11/10    stent, lithotripsy  . Blood transfusion without reported diagnosis age 59    lac artery and right sciatic nerve    Past Surgical History  Procedure Laterality Date  . Lithotripsy  12/2008  . Cesarean section      X 3  . Multiple laceration  age 59    contusion and laceration of iliac artery , abdomen, right leg and elbow  . Orif finger fracture Left 2013 ?    Current Outpatient Prescriptions  Medication Sig Dispense Refill  . ALPRAZolam (XANAX) 0.5 MG tablet Take 0.5 mg by mouth 3 (three) times daily as needed for anxiety.    Marland Kitchen. atorvastatin (LIPITOR) 20 MG tablet Take 20 mg by mouth daily.    Marland Kitchen. buPROPion (WELLBUTRIN SR) 150 MG 12 hr tablet Take 150 mg by mouth 2 (two) times daily.     . cetirizine (ZYRTEC) 10 MG tablet Take 10 mg by mouth  daily.    . diclofenac (VOLTAREN) 75 MG EC tablet Take 75 mg by mouth 2 (two) times daily.    . Misc Natural Products (LUTEIN 20 PO) Take by mouth.    . Vitamin D, Ergocalciferol, (DRISDOL) 50000 UNITS CAPS capsule Take 1 capsule (50,000 Units total) by mouth every 7 (seven) days. 30 capsule 3  . zolpidem (AMBIEN) 10 MG tablet Take 5 mg by mouth At bedtime.     No current facility-administered medications for this visit.    Family History  Problem Relation Age of Onset  . Heart disease Mother   . Hypertension Mother   . Pulmonary embolism Mother   . Heart disease Father   . Diabetes Father   . Hypertension Father   . Heart attack Father   . Parkinson's disease Father   . Heart disease Maternal Grandmother   . Heart attack Maternal Grandmother   . Multiple births Paternal Aunt     had twins  . Multiple sclerosis Maternal Grandfather   . Breast cancer Paternal Grandmother   . Hypertension Paternal Grandmother   . Hypertension Paternal Grandfather   . Diabetes Brother   . Heart disease Brother   . Diabetes Brother  ROS:  Pertinent items are noted in HPI.  Otherwise, a comprehensive ROS was negative.  Exam:   BP 124/82 mmHg  Pulse 68  Ht 5' 6.5" (1.689 m)  Wt 178 lb (80.74 kg)  BMI 28.30 kg/m2 Height: 5' 6.5" (168.9 cm) Ht Readings from Last 3 Encounters:  04/17/14 5' 6.5" (1.689 m)  06/18/13  (1.702 m)  04/12/13  (1.702 m)    General appearance: alert, cooperative and appears stated age Head: Normocephalic, without obvious abnormality, atraumatic Neck: no adenopathy, supple, symmetrical, trachea midline and thyroid normal to inspection and palpation Lungs: clear to auscultation bilaterally Breasts: normal appearance, no masses or tenderness Heart: regular rate and rhythm Abdomen: soft, non-tender; no masses,  no organomegaly Extremities: extremities normal, atraumatic, no cyanosis or edema Skin: Skin color, texture, turgor normal. No rashes or  lesions Lymph nodes: Cervical, supraclavicular, and axillary nodes normal. No abnormal inguinal nodes palpated Neurologic: Grossly normal   Pelvic: External genitalia:  no lesions              Urethra:  normal appearing urethra with no masses, tenderness or lesions              Bartholin's and Skene's: normal                 Vagina: normal appearing vagina with normal color and discharge, no lesions              Cervix: anteverted              Pap taken: Yes.   Bimanual Exam:  Uterus:  normal size, contour, position, consistency, mobility, non-tender              Adnexa: no mass, fullness, tenderness               Rectovaginal: Confirms               Anus:  normal sphincter tone, no lesions  Chaperone present:  yes  A:  Well Woman with normal exam  Postmenopausal no HRT Vit D deficient   P:   Reviewed health and wellness pertinent to exam  Pap smear taken today  Mammogram is due April 2016  Refill Vit D and follow with labs  Counseled on breast self exam, mammography screening, adequate intake of calcium and vitamin D, diet and exercise, Kegel's exercises return annually or prn  An After Visit Summary was printed and given to the patient.

## 2014-04-17 NOTE — Patient Instructions (Signed)

## 2014-04-18 LAB — VITAMIN D 25 HYDROXY (VIT D DEFICIENCY, FRACTURES): Vit D, 25-Hydroxy: 34 ng/mL (ref 30–100)

## 2014-04-21 NOTE — Progress Notes (Signed)
Encounter reviewed by Dr. Brook Silva.  

## 2014-04-22 NOTE — Addendum Note (Signed)
Addended by: Verner CholLEONARD, DEBORAH S on: 04/22/2014 08:35 AM   Modules accepted: Orders, SmartSet

## 2014-04-23 LAB — IPS PAP TEST WITH REFLEX TO HPV

## 2014-05-06 ENCOUNTER — Ambulatory Visit: Payer: Self-pay

## 2014-05-08 ENCOUNTER — Ambulatory Visit
Admission: RE | Admit: 2014-05-08 | Discharge: 2014-05-08 | Disposition: A | Discharge status: Home / Self Care | Payer: BLUE CROSS/BLUE SHIELD | Source: Ambulatory Visit

## 2014-05-08 DIAGNOSIS — Z1231 Encounter for screening mammogram for malignant neoplasm of breast: Secondary | ICD-10-CM

## 2014-07-12 ENCOUNTER — Other Ambulatory Visit: Payer: Self-pay | Admitting: Nurse Practitioner

## 2014-07-14 NOTE — Telephone Encounter (Signed)
04-17-14 #30/3refills given at office visit. Refill denied

## 2014-10-01 ENCOUNTER — Other Ambulatory Visit: Payer: Self-pay | Admitting: Nurse Practitioner

## 2014-10-01 NOTE — Telephone Encounter (Signed)
04/17/14 #30/3 rfs sent to [facility] [place] [address]-rx denied.

## 2014-12-13 ENCOUNTER — Other Ambulatory Visit: Payer: Self-pay | Admitting: Nurse Practitioner

## 2015-03-31 ENCOUNTER — Other Ambulatory Visit: Payer: Self-pay

## 2015-03-31 DIAGNOSIS — Z1231 Encounter for screening mammogram for malignant neoplasm of breast: Secondary | ICD-10-CM

## 2015-04-20 ENCOUNTER — Encounter: Payer: Self-pay | Admitting: Nurse Practitioner

## 2015-04-20 ENCOUNTER — Ambulatory Visit (INDEPENDENT_AMBULATORY_CARE_PROVIDER_SITE_OTHER): Payer: BLUE CROSS/BLUE SHIELD | Admitting: Nurse Practitioner

## 2015-04-20 VITALS — BP 100/68 | HR 68 | Temp 97.8°F | Resp 14 | Ht 66.6 in | Wt 173.0 lb

## 2015-04-20 DIAGNOSIS — Z780 Asymptomatic menopausal state: Secondary | ICD-10-CM | POA: Diagnosis not present

## 2015-04-20 DIAGNOSIS — Z01419 Encounter for gynecological examination (general) (routine) without abnormal findings: Secondary | ICD-10-CM

## 2015-04-20 DIAGNOSIS — Z0000 Encounter for general adult medical examination without abnormal findings: Secondary | ICD-10-CM

## 2015-04-20 DIAGNOSIS — E559 Vitamin D deficiency, unspecified: Secondary | ICD-10-CM | POA: Diagnosis not present

## 2015-04-20 MED ORDER — VITAMIN D (ERGOCALCIFEROL) 1.25 MG (50000 UNIT) PO CAPS: 50000.0000 [IU] | ORAL_CAPSULE | ORAL | Status: DC

## 2015-04-20 NOTE — Progress Notes (Signed)
60 y.o. [id number] Married  Caucasian Fe here for annual exam.  No new health problems other than sinusitis this spring.   Husband had prostate cancer surgery last year but doing well.  Patient's last menstrual period was 02/07/2009.          Sexually active: Yes.    The current method of family planning is post menopausal status.    Exercising: Yes.    Home exercise routine includes spin and yoga. Smoker:  no  Health Maintenance: Pap:  04-17-14 MMG:  05-08-14 Category C Bi-Rads 1 Neg Colonoscopy:  12/2008 normal recheck in 10 yrs BMD:   05-03-13 T Score 0.4 spine, left hip neck -1.5 TDaP:  2015- done by PCP Hep C and HIV: done today.  Labs: PCP   reports that she has never smoked. She has never used smokeless tobacco. She reports that she drinks about 1.2 oz of alcohol per week. She reports that she does not use illicit drugs.  Past Medical History  Diagnosis Date  . Vitamin D deficiency   . Fibrocystic breast 10/98  . Injury of right sciatic nerve 1971    w/laceration of artery and nerve, blood transfusion, fell through plate glass door  . Kidney stone 11/10    stent, lithotripsy  . Blood transfusion without reported diagnosis age 60    lac artery and right sciatic nerve    Past Surgical History  Procedure Laterality Date  . Lithotripsy  12/2008  . Cesarean section      X 3  . Multiple laceration  age 60    contusion and laceration of iliac artery , abdomen, right leg and elbow  . Orif finger fracture Left 2013 ?    Current Outpatient Prescriptions  Medication Sig Dispense Refill  . ADDERALL XR 10 MG 24 hr capsule Take 10 mg by mouth every morning.  0  . ALPRAZolam (XANAX) 0.5 MG tablet Take 0.5 mg by mouth 3 (three) times daily as needed for anxiety.    Marland Kitchen. atorvastatin (LIPITOR) 20 MG tablet Take 20 mg by mouth daily.    Marland Kitchen. buPROPion (WELLBUTRIN SR) 150 MG 12 hr tablet Take 150 mg by mouth 2 (two) times daily.     . cetirizine (ZYRTEC) 10 MG tablet Take 10 mg by mouth daily.     Marland Kitchen. levofloxacin (LEVAQUIN) 500 MG tablet   0  . Misc Natural Products (LUTEIN 20 PO) Take by mouth.    . Vitamin D, Ergocalciferol, (DRISDOL) 50000 units CAPS capsule Take 1 capsule (50,000 Units total) by mouth every 7 (seven) days. 30 capsule 3  . zolpidem (AMBIEN) 10 MG tablet Take 5 mg by mouth At bedtime.     No current facility-administered medications for this visit.    Family History  Problem Relation Age of Onset  . Heart disease Mother   . Hypertension Mother   . Pulmonary embolism Mother   . Heart disease Father   . Diabetes Father   . Hypertension Father   . Heart attack Father   . Parkinson's disease Father   . Heart disease Maternal Grandmother   . Heart attack Maternal Grandmother   . Multiple births Paternal Aunt     had twins  . Multiple sclerosis Maternal Grandfather   . Breast cancer Paternal Grandmother   . Hypertension Paternal Grandmother   . Hypertension Paternal Grandfather   . Diabetes Brother   . Heart disease Brother   . Diabetes Brother     ROS:  Pertinent items  are noted in HPI.  Otherwise, a comprehensive ROS was negative.  Exam:   BP 100/68 mmHg  Pulse 68  Temp(Src) 97.8 F (36.6 C) (Oral)  Resp 14  Ht 5' 6.6" (1.692 m)  Wt 173 lb (78.472 kg)  BMI 27.41 kg/m2  LMP 02/07/2009 Height: 5' 6.6" (169.2 cm) Ht Readings from Last 3 Encounters:  04/20/15 5' 6.6" (1.692 m)  04/17/14 5' 6.5" (1.689 m)  06/18/13  (1.702 m)    General appearance: alert, cooperative and appears stated age Head: Normocephalic, without obvious abnormality, atraumatic Neck: no adenopathy, supple, symmetrical, trachea midline and thyroid normal to inspection and palpation Lungs: clear to auscultation bilaterally Breasts: normal appearance, no masses or tenderness Heart: regular rate and rhythm Abdomen: soft, non-tender; no masses,  no organomegaly Extremities: extremities normal, atraumatic, no cyanosis or edema Skin: Skin color, texture, turgor normal.  No rashes or lesions Lymph nodes: Cervical, supraclavicular, and axillary nodes normal. No abnormal inguinal nodes palpated Neurologic: Grossly normal   Pelvic: External genitalia:  no lesions              Urethra:  normal appearing urethra with no masses, tenderness or lesions              Bartholin's and Skene's: normal                 Vagina: normal appearing vagina with normal color and discharge, no lesions              Cervix: anteverted              Pap taken: No. Bimanual Exam:  Uterus:  normal size, contour, position, consistency, mobility, non-tender              Adnexa: no mass, fullness, tenderness               Rectovaginal: Confirms               Anus:  normal sphincter tone, no lesions  Chaperone present: no  A:  Well Woman with normal exam  Postmenopausal no HRT Vit D deficient  P:   Reviewed health and wellness pertinent to exam  Pap smear as above  Mammogram is due 3/17 and scheduled  Will get copy of Vit d at PCP, refill RX for a year  Will follow with labs  Counseled on breast self exam, mammography screening, adequate intake of calcium and vitamin D, diet and exercise return annually or prn  An After Visit Summary was printed and given to the patient.

## 2015-04-21 LAB — HIV ANTIBODY (ROUTINE TESTING W REFLEX): HIV: NONREACTIVE

## 2015-04-21 LAB — HEPATITIS C ANTIBODY: HCV AB: NEGATIVE

## 2015-04-21 NOTE — Progress Notes (Signed)
Encounter reviewed by Dr. Andreya Lacks Amundson C. Silva.  

## 2015-04-27 DIAGNOSIS — R599 Enlarged lymph nodes, unspecified: Secondary | ICD-10-CM | POA: Insufficient documentation

## 2015-04-27 DIAGNOSIS — R591 Generalized enlarged lymph nodes: Secondary | ICD-10-CM | POA: Insufficient documentation

## 2015-04-27 DIAGNOSIS — R0981 Nasal congestion: Secondary | ICD-10-CM | POA: Insufficient documentation

## 2015-05-11 ENCOUNTER — Ambulatory Visit: Payer: BLUE CROSS/BLUE SHIELD

## 2015-05-26 ENCOUNTER — Ambulatory Visit: Payer: BLUE CROSS/BLUE SHIELD

## 2015-06-05 ENCOUNTER — Ambulatory Visit
Admission: RE | Admit: 2015-06-05 | Discharge: 2015-06-05 | Disposition: A | Discharge status: Home / Self Care | Payer: BLUE CROSS/BLUE SHIELD | Source: Ambulatory Visit

## 2015-06-05 DIAGNOSIS — Z1231 Encounter for screening mammogram for malignant neoplasm of breast: Secondary | ICD-10-CM

## 2015-07-01 ENCOUNTER — Telehealth: Payer: Self-pay | Admitting: Nurse Practitioner

## 2015-07-01 NOTE — Telephone Encounter (Signed)
Message left to return call to Deziya Amero at [phone].    

## 2015-07-01 NOTE — Telephone Encounter (Signed)
Call to patient. She c/o intermittent RLQ pain x 1 week. No pain today. She states she was prompted to call yesterday when she felt cramps before a bowel movement occurred for her. Typically, she has not had pain with a bowel movement. She also states she has been having increased hot flashes. Denies vaginal bleeding, denies fevers, denies bladder or bowel changes. She had URI symptoms 03/14/15 through 4/1 and was treated by PCP with multiple antibiotics. Denies diarrhea. She states she feels this pain is more "female in nature instead of gastroenterological."  She is offered office appointment and declines until she can see Ria CommentPatricia Grubb, FNP, declines earlier appointments offered. Scheduled 07/03/15 at 0830. She is advised to call back or seek emergency medical care if develops worsening pain, fevers, nausea, vomiting or diarrhea and is agreeable.  Routing to provider for final review. Patient agreeable to disposition. Will close encounter.

## 2015-07-01 NOTE — Telephone Encounter (Signed)
Patient returned call

## 2015-07-01 NOTE — Telephone Encounter (Signed)
Patient is having rt side pain and is asking to talk with Patty Grubb's nurse.

## 2015-07-03 ENCOUNTER — Ambulatory Visit (INDEPENDENT_AMBULATORY_CARE_PROVIDER_SITE_OTHER): Payer: BLUE CROSS/BLUE SHIELD | Admitting: Nurse Practitioner

## 2015-07-03 ENCOUNTER — Telehealth: Payer: Self-pay | Admitting: Nurse Practitioner

## 2015-07-03 ENCOUNTER — Encounter: Payer: Self-pay | Admitting: Nurse Practitioner

## 2015-07-03 VITALS — BP 126/72 | HR 74 | Temp 99.0°F | Resp 16 | Ht 67.0 in | Wt 174.0 lb

## 2015-07-03 DIAGNOSIS — G8929 Other chronic pain: Secondary | ICD-10-CM

## 2015-07-03 DIAGNOSIS — R1031 Right lower quadrant pain: Secondary | ICD-10-CM | POA: Diagnosis not present

## 2015-07-03 LAB — CBC WITH DIFFERENTIAL/PLATELET
BASOS ABS: 43 {cells}/uL (ref 0–200)
Basophils Relative: 1 %
EOS ABS: 258 {cells}/uL (ref 15–500)
Eosinophils Relative: 6 %
HCT: 38.3 % (ref 35.0–45.0)
Hemoglobin: 13.1 g/dL (ref 11.7–15.5)
LYMPHS PCT: 37 %
Lymphs Abs: 1591 cells/uL (ref 850–3900)
MCH: 30.1 pg (ref 27.0–33.0)
MCHC: 34.2 g/dL (ref 32.0–36.0)
MCV: 88 fL (ref 80.0–100.0)
MONOS PCT: 8 %
MPV: 9.7 fL (ref 7.5–12.5)
Monocytes Absolute: 344 cells/uL (ref 200–950)
NEUTROS PCT: 48 %
Neutro Abs: 2064 cells/uL (ref 1500–7800)
PLATELETS: 312 10*3/uL (ref 140–400)
RBC: 4.35 MIL/uL (ref 3.80–5.10)
RDW: 13 % (ref 11.0–15.0)
WBC: 4.3 10*3/uL (ref 3.8–10.8)

## 2015-07-03 LAB — COMPREHENSIVE METABOLIC PANEL
ALT: 19 U/L (ref 6–29)
AST: 20 U/L (ref 10–35)
Albumin: 4.5 g/dL (ref 3.6–5.1)
Alkaline Phosphatase: 87 U/L (ref 33–130)
BUN: 17 mg/dL (ref 7–25)
CO2: 27 mmol/L (ref 20–31)
Calcium: 9.5 mg/dL (ref 8.6–10.4)
Chloride: 102 mmol/L (ref 98–110)
Creat: 0.89 mg/dL (ref 0.50–1.05)
Glucose, Bld: 92 mg/dL (ref 65–99)
POTASSIUM: 4.5 mmol/L (ref 3.5–5.3)
Sodium: 141 mmol/L (ref 135–146)
TOTAL PROTEIN: 6.5 g/dL (ref 6.1–8.1)
Total Bilirubin: 0.7 mg/dL (ref 0.2–1.2)

## 2015-07-03 LAB — SEDIMENTATION RATE: SED RATE: 1 mm/h (ref 0–30)

## 2015-07-03 NOTE — Telephone Encounter (Signed)
Called patient to review benefits for a recommended procedure. Left Voicemail requesting a call back. °

## 2015-07-03 NOTE — Progress Notes (Signed)
Subjective:     Patient ID: April Silva, female   DOB: 1956/01/23, 60 y.o.   MRN: [id number]  HPI  This 60 yo WM female G3P3 presents with lower abdominal pain with onset over a month.  Very gradual and intermittent, now daily for 2 wk's.  Feels like pain is 'like a Braxton Hicks contraction" and has more of a throbbing nature.  Her  Primary concern that this might be OV cancer as a friend was recently diagnosed and had similar symptoms.  Has nocturia X 3 but this has been her normal.  She denies vaginal symptoms.  Bowel changes with more constipation requiring that she take stool softeners.  No aggravation of symptoms with certain types of foods.  She has been eating nuts.   Occasionally has noted mucous with her stool when she wipes.  No travel outside of [place] since 02/2015.  Some low grade fever that has also been intermittent.  Some increase in vaso symptoms.  History of renal calculi in the past with 2 outpatient procedures by dr. Aldean Ast in 09/2008 and & 12/2008 with lithotripsy.   Back in February through March she did have bad URI, sinusitis and pneumonia.  Took 4 antibiotics without bowel changes or diarrhea.       Review of Systems  Constitutional: Positive for fever and fatigue. Negative for chills.  HENT: Negative.   Respiratory: Negative.   Cardiovascular: Negative.   Gastrointestinal: Positive for abdominal pain and constipation. Negative for nausea, vomiting, diarrhea, blood in stool, anal bleeding and rectal pain.       Noted mucous in stool.  Endocrine: Positive for heat intolerance.  Genitourinary: Positive for pelvic pain. Negative for dysuria, frequency, hematuria, flank pain, vaginal bleeding, vaginal discharge, difficulty urinating, vaginal pain, menstrual problem and dyspareunia.  Musculoskeletal: Positive for back pain.  Neurological: Negative for dizziness, seizures, light-headedness and headaches.  Hematological: Positive for adenopathy.       Bilateral cervical nodes  swollen since URI symptoms.  Psychiatric/Behavioral: Negative.        Objective:   Physical Exam  Constitutional: She is oriented to person, place, and time. She appears well-developed and well-nourished. No distress.  HENT:  Head: Normocephalic.  Right Ear: External ear normal.  Left Ear: External ear normal.  Mouth/Throat: Oropharynx is clear and moist.  Bilateral shotty anterior cervical nodes.  Neck: Normal range of motion.  Cardiovascular: Normal heart sounds.   Pulmonary/Chest: Effort normal and breath sounds normal.  Abdominal: Soft. Bowel sounds are normal. She exhibits no distension and no mass. There is no tenderness. There is no rebound and no guarding.  Genitourinary:  Normal vaginal discharge.  Bimanual exam without mass.  No absolute reproduction of pain, but generalized pressure mid abdomen and RLQ.  Neurological: She is alert and oriented to person, place, and time.  Psychiatric: She has a normal mood and affect. Her behavior is normal. Judgment and thought content normal.       Assessment:     Right mid to lower quadrant pain Low grade fever Recent constipation treated with stool softners Concerns about OV cancer    Plan:     Will get STAT CBC and follow Will get sed rate, CA 125, CMP and follow Will try to get PUS next week She is to CB if any symptoms change or worsens She is going [place] the mountains this weekend - will call her along the way with her  STAT CBC results in case she needs to return to  [place].  She is aware to seek medical care in that area if symptoms worsen

## 2015-07-03 NOTE — Patient Instructions (Signed)
I will call you with STAT CBC results.

## 2015-07-04 LAB — CA 125: CA 125: 5 U/mL (ref <35)

## 2015-07-05 LAB — URINE CULTURE
COLONY COUNT: NO GROWTH
Organism ID, Bacteria: NO GROWTH

## 2015-07-06 ENCOUNTER — Telehealth: Payer: Self-pay | Admitting: Nurse Practitioner

## 2015-07-06 DIAGNOSIS — R1031 Right lower quadrant pain: Secondary | ICD-10-CM

## 2015-07-06 NOTE — Telephone Encounter (Signed)
Pt. Was notified of normal Ca 125 , CMP, CBC, sed rate.  She will call Susie back on Tuesday and pursue getting PUS.  May not be able to do on Tuesday but hopefully on Thursday.

## 2015-07-06 NOTE — Progress Notes (Signed)
Encounter reviewed by Dr. Brook Amundson C. Silva.  

## 2015-07-07 ENCOUNTER — Encounter: Payer: Self-pay | Admitting: Obstetrics and Gynecology

## 2015-07-07 ENCOUNTER — Ambulatory Visit (INDEPENDENT_AMBULATORY_CARE_PROVIDER_SITE_OTHER): Payer: BLUE CROSS/BLUE SHIELD

## 2015-07-07 ENCOUNTER — Ambulatory Visit (INDEPENDENT_AMBULATORY_CARE_PROVIDER_SITE_OTHER): Payer: BLUE CROSS/BLUE SHIELD | Admitting: Obstetrics and Gynecology

## 2015-07-07 VITALS — BP 118/72 | HR 76 | Resp 14 | Wt 173.0 lb

## 2015-07-07 DIAGNOSIS — R1031 Right lower quadrant pain: Secondary | ICD-10-CM | POA: Diagnosis not present

## 2015-07-07 DIAGNOSIS — R195 Other fecal abnormalities: Secondary | ICD-10-CM | POA: Diagnosis not present

## 2015-07-07 DIAGNOSIS — K5900 Constipation, unspecified: Secondary | ICD-10-CM

## 2015-07-07 DIAGNOSIS — R1030 Lower abdominal pain, unspecified: Secondary | ICD-10-CM

## 2015-07-07 NOTE — Telephone Encounter (Signed)
Spoke with pt regarding benefit for ultrasound. Patient understood and agreeable. Patient scheduled 07/07/15 with Dr Oscar LaJertson. Pt aware of arrival date and time. Pt aware of 72 hours cancellation policy with fee. No further questions. Ok to close

## 2015-07-07 NOTE — Progress Notes (Signed)
GYNECOLOGY  VISIT   HPI: 60 y.o.   Married  Caucasian  female   [phone] with Patient's last menstrual period was 02/07/2009.   here for pelvic U/S for lower abdominal/pelvic pain. As part of her evaluation she has had a normal CBC, CMP, CA 125, sed rate and urine culture.  The patient c/o a 6 week h/o intermittent RLQ to lower mid abdomen. It had been more frequent, now getting better. Over the last 4 days only one episode of pain. The pain is a contraction like feeling, lasts for a couple of seconds. Up to a 4/10 in severity.  She has been having some increase in her constipation and has been taking a stool softener for the last month. Bowels are getting better. She has had 3 stools today, not diarrhea. Usually 1 x a day, but they had been hard. She has been noticing mucous in her stool daily. She had a colonoscopy about 7 years ago.   GYNECOLOGIC HISTORY: Patient's last menstrual period was 02/07/2009. Contraception:postmenopause Menopausal hormone therapy: none         OB History    Gravida Para Term Preterm AB TAB SAB Ectopic Multiple Living   3 3 3       3          Patient Active Problem List   Diagnosis Date Noted  . Pain in joint, ankle and foot 06/18/2013  . Plantar fascia rupture 03/07/2012  . Depression 05/02/2011  . 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 Vitamin D deficiency   . Fibrocystic breast 10/98  . Injury of right sciatic nerve 1971    w/laceration of artery and nerve, blood transfusion, fell through plate glass door  . Kidney stone 11/10    stent, lithotripsy  . Blood transfusion without reported diagnosis age 60    lac artery and right sciatic nerve    Past Surgical History  Procedure Laterality Date  . Lithotripsy  12/2008  . Cesarean section      X 3  . Multiple laceration  age 60    contusion and laceration of iliac artery , abdomen, right leg and elbow  . Orif finger fracture  Left 2013 ?    Current Outpatient Prescriptions  Medication Sig Dispense Refill  . acetaminophen (TYLENOL) 500 MG tablet Take 200 mg by mouth 3 (three) times daily. Take 2 tablets three times a day    . ADDERALL XR 10 MG 24 hr capsule Take 10 mg by mouth every morning.  0  . ALPRAZolam (XANAX) 0.5 MG tablet Take 0.5 mg by mouth 3 (three) times daily as needed for anxiety.    Marland Kitchen. atorvastatin (LIPITOR) 20 MG tablet Take 20 mg by mouth daily.    Marland Kitchen. buPROPion (WELLBUTRIN SR) 150 MG 12 hr tablet Take 150 mg by mouth 2 (two) times daily.     . cetirizine (ZYRTEC) 10 MG tablet Take 10 mg by mouth daily.    Tery Sanfilippo. Docusate Calcium (STOOL SOFTENER PO) Take by mouth as needed.    Marland Kitchen. ibuprofen (ADVIL,MOTRIN) 200 MG tablet Take 200 mg by mouth every 6 (six) hours as needed.    . Lutein 20 MG CAPS     . Vitamin D, Ergocalciferol, (DRISDOL) 50000 units CAPS capsule Take 1 capsule (50,000 Units total) by mouth every 7 (seven) days. 30 capsule 3  . zolpidem (AMBIEN) 10 MG tablet Take 5 mg by mouth At  bedtime.     No current facility-administered medications for this visit.     ALLERGIES: Review of patient's allergies indicates no known allergies.  Family History  Problem Relation Age of Onset  . Heart disease Mother   . Hypertension Mother   . Pulmonary embolism Mother   . Heart disease Father   . Diabetes Father   . Hypertension Father   . Heart attack Father   . Parkinson's disease Father   . Heart disease Maternal Grandmother   . Heart attack Maternal Grandmother   . Multiple births Paternal Aunt     had twins  . Multiple sclerosis Maternal Grandfather   . Breast cancer Paternal Grandmother   . Hypertension Paternal Grandmother   . Hypertension Paternal Grandfather   . Diabetes Brother   . Heart disease Brother   . Diabetes Brother     Social History   Social History  . Marital Status: Married    Spouse Name: N/A  . Number of Children: 3  . Years of Education: N/A   Occupational  History  .     Social History Main Topics  . Smoking status: Never Smoker   . Smokeless tobacco: Never Used  . Alcohol Use: 1.2 oz/week    2 Standard drinks or equivalent per week  . Drug Use: No  . Sexual Activity:    Partners: Male    Birth Control/ Protection: Post-menopausal   Other Topics Concern  . Not on file   Social History Narrative    Review of Systems  Constitutional: Negative.   HENT: Negative.   Eyes: Negative.   Respiratory: Negative.   Cardiovascular: Negative.   Gastrointestinal: Negative.   Genitourinary: Negative.   Musculoskeletal: Negative.   Skin: Negative.   Neurological: Negative.   Endo/Heme/Allergies: Negative.   Psychiatric/Behavioral: Negative.     PHYSICAL EXAMINATION:    BP 118/72 mmHg  Pulse 76  Resp 14  Wt 173 lb (78.472 kg)  LMP 02/07/2009    General appearance: alert, cooperative and appears stated age Abdomen: soft, non-tender; mildly distended, no masses,  no organomegaly  Reviewed the ultrasound pictures with the patient   ASSESSMENT Abdominal pain, normal ultrasound (few small myomas) Mucous in her stools Constipation, improved with stool softeners     PLAN Reassured that her labs and ultrasound were normal Recommend she f/u with her GI MD, she will make an appointment.    An After Visit Summary was printed and given to the patient.  15 minutes face to face time of which over 50% was spent in counseling.    CC: Mauricia Area, NP

## 2015-07-07 NOTE — Addendum Note (Signed)
Addended by: Joeseph AmorFAST, Crystalee Ventress L on: 07/07/2015 12:12 PM   Modules accepted: Orders

## 2015-07-07 NOTE — Telephone Encounter (Signed)
Return call to Suzy. °

## 2015-07-07 NOTE — Telephone Encounter (Signed)
[facility]

## 2015-07-17 ENCOUNTER — Telehealth: Payer: Self-pay | Admitting: Obstetrics and Gynecology

## 2015-07-17 DIAGNOSIS — R195 Other fecal abnormalities: Secondary | ICD-10-CM

## 2015-07-17 NOTE — Telephone Encounter (Signed)
Referral appointment received. Tuesday 07/21/15 @ 830 with Dr Loreta AveMann. This is the next day Dr Loreta AveMann is back in the office. Call to patient. Referral information given. Patient agreeable and thankful for quick appointment.  Route to Dr Oscar LaJertson to review and close.

## 2015-07-17 NOTE — Telephone Encounter (Signed)
Patient calling to request a referral to Dr. Jolee EwingJyothi for abdominal pain after seeing her primary care provider and discussing the recommendation from Dr. Oscar LaJertson. Patient also has "newer questions" about her pain for the nurse

## 2015-07-17 NOTE — Telephone Encounter (Signed)
Incoming call from patient. States PCP appointment was already scheduled so she want but did not feel like she had any resolution.  The very next day she had blood in stool and reported this to PCP who stated this could be related to constipation.  She reports she has "pockets of mucus mixed with blood" that are with stool.  Very anxious and desires first available appointment with Dr Loreta AveMann.  States abdominal pain is actually better.  Advised if has active heavy rectal bleeding, seek care at closest ED. Advised will review with Dr Oscar LaJertson and if approved, will call her back with Dr Loreta AveMann appointment.  Reviewed previous office visit note, Dr Oscar LaJertson recommended GI appointment. Referral entered.

## 2015-07-17 NOTE — Telephone Encounter (Signed)
Left message to call Kaidon Kinker at [phone]. 

## 2015-07-28 NOTE — Telephone Encounter (Signed)
Routing to provider for final review. Patient agreeable to disposition. Will close encounter.     

## 2015-12-21 ENCOUNTER — Other Ambulatory Visit: Payer: Self-pay | Admitting: Chiropractic Medicine

## 2015-12-21 ENCOUNTER — Ambulatory Visit
Admission: RE | Admit: 2015-12-21 | Discharge: 2015-12-21 | Disposition: A | Discharge status: Home / Self Care | Payer: BLUE CROSS/BLUE SHIELD | Source: Ambulatory Visit | Attending: Chiropractic Medicine | Admitting: Chiropractic Medicine

## 2015-12-21 DIAGNOSIS — G8929 Other chronic pain: Secondary | ICD-10-CM

## 2015-12-21 DIAGNOSIS — M542 Cervicalgia: Principal | ICD-10-CM

## 2016-04-20 ENCOUNTER — Ambulatory Visit: Payer: BLUE CROSS/BLUE SHIELD | Admitting: Nurse Practitioner

## 2016-05-11 ENCOUNTER — Other Ambulatory Visit: Payer: Self-pay | Admitting: Family Medicine

## 2016-05-11 DIAGNOSIS — Z1231 Encounter for screening mammogram for malignant neoplasm of breast: Secondary | ICD-10-CM

## 2016-05-23 ENCOUNTER — Encounter: Payer: Self-pay | Admitting: Nurse Practitioner

## 2016-05-23 ENCOUNTER — Ambulatory Visit (INDEPENDENT_AMBULATORY_CARE_PROVIDER_SITE_OTHER): Payer: BLUE CROSS/BLUE SHIELD | Admitting: Nurse Practitioner

## 2016-05-23 VITALS — BP 124/82 | HR 60 | Ht 67.75 in | Wt 171.0 lb

## 2016-05-23 DIAGNOSIS — E2839 Other primary ovarian failure: Secondary | ICD-10-CM | POA: Diagnosis not present

## 2016-05-23 DIAGNOSIS — Z0000 Encounter for general adult medical examination without abnormal findings: Secondary | ICD-10-CM

## 2016-05-23 DIAGNOSIS — Z86010 Personal history of colonic polyps: Secondary | ICD-10-CM | POA: Diagnosis not present

## 2016-05-23 DIAGNOSIS — Z780 Asymptomatic menopausal state: Secondary | ICD-10-CM

## 2016-05-23 DIAGNOSIS — E559 Vitamin D deficiency, unspecified: Secondary | ICD-10-CM

## 2016-05-23 DIAGNOSIS — Z01419 Encounter for gynecological examination (general) (routine) without abnormal findings: Secondary | ICD-10-CM | POA: Diagnosis not present

## 2016-05-23 NOTE — Patient Instructions (Signed)

## 2016-05-23 NOTE — Progress Notes (Signed)
61 y.o. G59P3003 Married  Caucasian Fe here for annual exam.  In 06/2015 was having abdominal pain - evaluation of CBC, urine C&S, CA 125, and PUS was all normal.  Then went to GI and saw Dr. Loreta Ave.  She had colonoscopy and found 3-4 polyps that were precancer. She is retired and helps out with grandchildren.  The whole family is going to the beach in May.  Patient's last menstrual period was 02/07/2009 (approximate).          Sexually active: Yes.    The current method of family planning is post menopausal status.    Exercising: Yes.    Hot Yoga 3-5 times a week  Smoker:  no  Health Maintenance: Pap: 04/17/14, Negative  03/29/12, Negative with neg HR HPV MMG: 06/05/15, 3D, Bi-Rads 1:  Negative Colonoscopy:  07/2015 Dr. Loreta Ave ?4 polyps that were pre cancerous repeat in 5 yrs. BMD: 05-03-13 T Score 0.4 spine, left hip neck -1.5 TDaP: 2015 Hep C and HIV: 04/20/15 Labs: PCP takes care of all labs   reports that she has never smoked. She has never used smokeless tobacco. She reports that she drinks about 1.2 oz of alcohol per week . She reports that she does not use drugs.  Past Medical History:  Diagnosis Date  . Blood transfusion without reported diagnosis age 72   lac artery and right sciatic nerve  . Fibrocystic breast 10/98  . Injury of right sciatic nerve 1971   w/laceration of artery and nerve, blood transfusion, fell through plate glass door  . Kidney stone 11/10   stent, lithotripsy  . Vitamin D deficiency     Past Surgical History:  Procedure Laterality Date  . CESAREAN SECTION     X 3  . LITHOTRIPSY  12/2008  . multiple laceration  age 55   contusion and laceration of iliac artery , abdomen, right leg and elbow  . ORIF FINGER FRACTURE Left 2013 ?    Current Outpatient Prescriptions  Medication Sig Dispense Refill  . acetaminophen (TYLENOL) 500 MG tablet Take 200 mg by mouth 3 (three) times daily. Take 2 tablets three times a day    . ADDERALL XR 10 MG 24 hr capsule Take 10  mg by mouth every morning.  0  . ALPRAZolam (XANAX) 0.5 MG tablet Take 0.5 mg by mouth 3 (three) times daily as needed for anxiety.    Marland Kitchen atorvastatin (LIPITOR) 20 MG tablet Take 20 mg by mouth daily.    Marland Kitchen buPROPion (WELLBUTRIN SR) 150 MG 12 hr tablet Take 150 mg by mouth 2 (two) times daily.     . cetirizine (ZYRTEC) 10 MG tablet Take 10 mg by mouth daily.    Tery Sanfilippo Calcium (STOOL SOFTENER PO) Take by mouth as needed.    . Lutein 20 MG CAPS     . Vitamin D, Ergocalciferol, (DRISDOL) 50000 units CAPS capsule Take 1 capsule (50,000 Units total) by mouth every 7 (seven) days. 30 capsule 3  . zolpidem (AMBIEN) 10 MG tablet Take 5 mg by mouth At bedtime.     No current facility-administered medications for this visit.     Family History  Problem Relation Age of Onset  . Heart disease Mother   . Hypertension Mother   . Pulmonary embolism Mother   . Heart disease Father   . Diabetes Father   . Hypertension Father   . Heart attack Father   . Parkinson's disease Father   . Heart disease Maternal Grandmother   .  Heart attack Maternal Grandmother   . Multiple sclerosis Maternal Grandfather   . Diabetes Brother   . Heart disease Brother   . Diabetes Brother   . Multiple births Paternal Aunt     had twins  . Breast cancer Paternal Grandmother   . Hypertension Paternal Grandmother   . Hypertension Paternal Grandfather     ROS:  Pertinent items are noted in HPI.  Otherwise, a comprehensive ROS was negative.  Exam:   BP 124/82 (BP Location: Right Arm, Patient Position: Sitting, Cuff Size: Large)   Pulse 60   Ht 5' 7.75" (1.721 m)   Wt 171 lb (77.6 kg)   LMP 02/07/2009 (Approximate)   BMI 26.19 kg/m  Height: 5' 7.75" (172.1 cm) Ht Readings from Last 3 Encounters:  05/23/16 5' 7.75" (1.721 m)  07/03/15  (1.702 m)  04/20/15 5' 6.6" (1.692 m)    General appearance: alert, cooperative and appears stated age Head: Normocephalic, without obvious abnormality, atraumatic Neck:  no adenopathy, supple, symmetrical, trachea midline and thyroid normal to inspection and palpation Lungs: clear to auscultation bilaterally Breasts: normal appearance, no masses or tenderness Heart: regular rate and rhythm Abdomen: soft, non-tender; no masses,  no organomegaly Extremities: extremities normal, atraumatic, no cyanosis or edema Skin: Skin color, texture, turgor normal. No rashes or lesions Lymph nodes: Cervical, supraclavicular, and axillary nodes normal. No abnormal inguinal nodes palpated Neurologic: Grossly normal   Pelvic: External genitalia:  no lesions              Urethra:  normal appearing urethra with no masses, tenderness or lesions              Bartholin's and Skene's: normal                 Vagina: normal appearing vagina with normal color and discharge, no lesions              Cervix: anteverted              Pap taken: No. Bimanual Exam:  Uterus:  normal size, contour, position, consistency, mobility, non-tender              Adnexa: no mass, fullness, tenderness               Rectovaginal: Confirms               Anus:  normal sphincter tone, no lesions  Chaperone present: yes  A:  Well Woman with normal exam  Postmenopausal no HRT Vit D deficient - now followed by PCP  History of colonic adenomatous polyps  P:   Reviewed health and wellness pertinent to exam  Pap smear not done  Mammogram is due in May and will get BMD at same time -order is placed.  PCP is now following Vit D  Recommended OTC lubrication prn  Counseled on breast self exam, mammography screening, adequate intake of calcium and vitamin D, diet and exercise, Kegel's exercises return annually or prn  An After Visit Summary was printed and given to the patient.  Will get report of colonoscopy and pathology for our chart.

## 2016-05-26 NOTE — Progress Notes (Signed)
Encounter reviewed by Dr. Densel Kronick Amundson C. Silva.  

## 2016-06-09 ENCOUNTER — Ambulatory Visit
Admission: RE | Admit: 2016-06-09 | Discharge: 2016-06-09 | Disposition: A | Discharge status: Home / Self Care | Payer: BLUE CROSS/BLUE SHIELD | Source: Ambulatory Visit | Attending: Family Medicine | Admitting: Family Medicine

## 2016-06-09 DIAGNOSIS — Z1231 Encounter for screening mammogram for malignant neoplasm of breast: Secondary | ICD-10-CM

## 2016-06-30 ENCOUNTER — Ambulatory Visit
Admission: RE | Admit: 2016-06-30 | Discharge: 2016-06-30 | Disposition: A | Discharge status: Home / Self Care | Payer: BLUE CROSS/BLUE SHIELD | Source: Ambulatory Visit | Attending: Nurse Practitioner | Admitting: Nurse Practitioner

## 2016-06-30 DIAGNOSIS — E2839 Other primary ovarian failure: Secondary | ICD-10-CM

## 2016-07-01 ENCOUNTER — Telehealth: Payer: Self-pay

## 2016-07-01 NOTE — Telephone Encounter (Signed)
-----   Message from Ria CommentPatricia Grubb, FNP sent at 07/01/2016  3:20 PM EDT ----- Please let pt know that BMD done on 06/30/16 shows that the spine was not used due to degenerative changes.  The T Score at the right hip neck is -1.7; left hip -1.8 and left forearm -0.9.  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  She does have a decrease at the left hip since prior study 05/03/13.  Needs to continue with exercise, calcium and Vit D support.  Repeat again in 2 yrs.

## 2016-07-01 NOTE — Telephone Encounter (Signed)
Left message for patient to call April Silva back.  

## 2016-07-01 NOTE — Telephone Encounter (Signed)
Patient advised of BMD results. Patient verbalized understanding and agreement.  

## 2016-07-11 ENCOUNTER — Ambulatory Visit (INDEPENDENT_AMBULATORY_CARE_PROVIDER_SITE_OTHER): Payer: BLUE CROSS/BLUE SHIELD | Admitting: Nurse Practitioner

## 2016-07-11 ENCOUNTER — Encounter: Payer: Self-pay | Admitting: Nurse Practitioner

## 2016-07-11 VITALS — BP 120/72 | HR 64 | Ht 67.75 in | Wt 173.0 lb

## 2016-07-11 DIAGNOSIS — N760 Acute vaginitis: Secondary | ICD-10-CM

## 2016-07-11 MED ORDER — NYSTATIN 100000 UNIT/GM EX CREA: 1.0000 "application " | TOPICAL_CREAM | Freq: Two times a day (BID) | CUTANEOUS | 1 refills | Status: DC

## 2016-07-11 MED ORDER — FLUCONAZOLE 150 MG PO TABS: 150.0000 mg | ORAL_TABLET | Freq: Once | ORAL | 0 refills | Status: AC

## 2016-07-11 MED ORDER — TRIAMCINOLONE ACETONIDE 0.025 % EX OINT: 1.0000 "application " | TOPICAL_OINTMENT | Freq: Two times a day (BID) | CUTANEOUS | 1 refills | Status: DC

## 2016-07-11 NOTE — Patient Instructions (Addendum)
Vaginal Yeast infection, Adult Vaginal yeast infection is a condition that causes soreness, swelling, and redness (inflammation) of the vagina. It also causes vaginal discharge. This is a common condition. Some women get this infection frequently. What are the causes? This condition is caused by a change in the normal balance of the yeast (candida) and bacteria that live in the vagina. This change causes an overgrowth of yeast, which causes the inflammation. What increases the risk? This condition is more likely to develop in:  Women who take antibiotic medicines.  Women who have diabetes.  Women who take birth control pills.  Women who are pregnant.  Women who douche often.  Women who have a weak defense (immune) system.  Women who have been taking steroid medicines for a long time.  Women who frequently wear tight clothing.  What are the signs or symptoms? Symptoms of this condition include:  White, thick vaginal discharge.  Swelling, itching, redness, and irritation of the vagina. The lips of the vagina (vulva) may be affected as well.  Pain or a burning feeling while urinating.  Pain during sex.  How is this diagnosed? This condition is diagnosed with a medical history and physical exam. This will include a pelvic exam. Your health care provider will examine a sample of your vaginal discharge under a microscope. Your health care provider may send this sample for testing to confirm the diagnosis. How is this treated? This condition is treated with medicine. Medicines may be over-the-counter or prescription. You may be told to use one or more of the following:  Medicine that is taken orally.  Medicine that is applied as a cream.  Medicine that is inserted directly into the vagina (suppository).  Follow these instructions at home:  Take or apply over-the-counter and prescription medicines only as told by your health care provider.  Do not have sex until your health  care provider has approved. Tell your sex partner that you have a yeast infection. That person should go to his or her health care provider if he or she develops symptoms.  Do not wear tight clothes, such as pantyhose or tight pants.  Avoid using tampons until your health care provider approves.  Eat more yogurt. This may help to keep your yeast infection from returning.  Try taking a sitz bath to help with discomfort. This is a warm water bath that is taken while you are sitting down. The water should only come up to your hips and should cover your buttocks. Do this 3-4 times per day or as told by your health care provider.  Do not douche.  Wear breathable, cotton underwear.  If you have diabetes, keep your blood sugar levels under control. Contact a health care provider if:  You have a fever.  Your symptoms go away and then return.  Your symptoms do not get better with treatment.  Your symptoms get worse.  You have new symptoms.  You develop blisters in or around your vagina.  You have blood coming from your vagina and it is not your menstrual period.  You develop pain in your abdomen. This information is not intended to replace advice given to you by your health care provider. Make sure you discuss any questions you have with your health care provider. Document Released: 11/03/2004 Document Revised: 07/08/2015 Document Reviewed: 07/28/2014 Elsevier Interactive Patient Education  2018 [employer]     Calcium  - Cal - Mag  (250)  Calcium Citrate  Calcium Gluconate Calcium daily 1200  mg daily both dietary and supplement

## 2016-07-11 NOTE — Progress Notes (Signed)
61 y.o. Married Caucasian female [phone] here with complaint of vaginal symptoms of itching, burning, and increase discharge. Describes discharge as white and thick.   Onset of symptoms about May 13 th while at the beach.  She did have a bathing suit on for much of time.   Saw PCP and he gave her Diflucan 150 mg.  She did not understand about taking both pills at 72 hour interval.  So at 10 days later took the second dose which may be helping her some now.  They are renovating the kitchen and she is spending more time in the heat in additional to her hot yoga.    Denies new personal products or vaginal dryness.  No STD concerns. Urinary symptoms none. While here she wants to review her BMD done on 07/01/16:  The spine was not used due to degenerative changes.  She is concerned about this as she also has cervical neck disk problem as well.  She is doing multiple things to her posture and back.  The hip measurements were at -1.7 and -1.8.  She is aware of walking, treadmill, etc.  She will also pay close attention to calcium and Vit D intake.  Past Medical History:  Diagnosis Date  . Blood transfusion without reported diagnosis age 64   lac artery and right sciatic nerve  . Fibrocystic breast 10/98  . Injury of right sciatic nerve 1971   w/laceration of artery and nerve, blood transfusion, fell through plate glass door  . Kidney stone 11/10   stent, lithotripsy  . Vitamin D deficiency     Past Surgical History:  Procedure Laterality Date  . CESAREAN SECTION     X 3  . LITHOTRIPSY  12/2008  . multiple laceration  age 34   contusion and laceration of iliac artery , abdomen, right leg and elbow  . ORIF FINGER FRACTURE Left 2013 ?     Current Outpatient Prescriptions:  .  acetaminophen (TYLENOL) 500 MG tablet, Take 200 mg by mouth 3 (three) times daily. Take 2 tablets three times a day, Disp: , Rfl:  .  ADDERALL XR 10 MG 24 hr capsule, Take 10 mg by mouth every morning., Disp: , Rfl: 0 .   ALPRAZolam (XANAX) 0.5 MG tablet, Take 0.5 mg by mouth 3 (three) times daily as needed for anxiety., Disp: , Rfl:  .  atorvastatin (LIPITOR) 20 MG tablet, Take 20 mg by mouth daily., Disp: , Rfl:  .  buPROPion (WELLBUTRIN SR) 150 MG 12 hr tablet, Take 150 mg by mouth 2 (two) times daily. , Disp: , Rfl:  .  cetirizine (ZYRTEC) 10 MG tablet, Take 10 mg by mouth daily., Disp: , Rfl:  .  Docusate Calcium (STOOL SOFTENER PO), Take by mouth as needed., Disp: , Rfl:  .  Lutein 20 MG CAPS, , Disp: , Rfl:  .  Vitamin D, Ergocalciferol, (DRISDOL) 50000 units CAPS capsule, Take 1 capsule (50,000 Units total) by mouth every 7 (seven) days., Disp: 30 capsule, Rfl: 3 .  zolpidem (AMBIEN) 10 MG tablet, Take 5 mg by mouth At bedtime., Disp: , Rfl:  .  fluconazole (DIFLUCAN) 150 MG tablet, Take 1 tablet (150 mg total) by mouth once. Take one tablet.  Repeat in 48 hours if symptoms are not completely resolved., Disp: 2 tablet, Rfl: 0 .  nystatin cream (MYCOSTATIN), Apply 1 application topically 2 (two) times daily. Apply to affected area BID prn, Disp: 30 g, Rfl: 1 .  triamcinolone (KENALOG) 0.025 %  ointment, Apply 1 application topically 2 (two) times daily., Disp: 30 g, Rfl: 1  ALLERGIES: Meperidine  O:  Healthy female WDWN Affect: normal, orientation x 3  Physical Exam:  BP 120/72 (BP Location: Right Arm, Patient Position: Sitting, Cuff Size: Normal)   Pulse 64   Ht 5' 7.75" (1.721 m)   Wt 173 lb (78.5 kg)   LMP 02/07/2009 (Approximate)   BMI 26.50 kg/m  General appearance: alert, cooperative and appears stated age Abdomen: Lymph node: no enlargement or tenderness Pelvic exam: External genital: normal female with several external areas of linear cuts and erythema suggestive of chronic yeast. BUS: negative Vagina: white thin discharge noted.  Affirm taken.    A: R/O Vaginitis   P: Discussed findings of vaginitis and etiology. Discussed Aveeno or baking soda sitz bath for comfort. Avoid moist  clothes or pads for extended period of time. If working out in gym clothes or swim suits for long periods of time change underwear or bottoms of swimsuit if possible. Olive Oil/Coconut Oil use for skin protection prior to activity can be used to external skin.  Rx: she is given RX for Triamcinolone and Nystatin to mix together and apply to the perianal and vulvar areas  Will follow up with Affirm - (if BV as well she will want Metrogel.)  Follow with [facility]  RV prn

## 2016-07-12 ENCOUNTER — Telehealth: Payer: Self-pay | Admitting: Nurse Practitioner

## 2016-07-12 NOTE — Telephone Encounter (Signed)
Patient calling for results from yesterday. °

## 2016-07-12 NOTE — Telephone Encounter (Signed)
Reviewed with Ria CommentPatricia Grubb, NP, patient can start diflucan now, will notify of results once received.    Spoke with patient, advised as seen below per Ria CommentPatricia Grubb, NP. Patient verbalizes understanding and is agreeable.   Routing to provider for final review. Patient is agreeable to disposition. Will close encounter.

## 2016-07-12 NOTE — Telephone Encounter (Signed)
Spoke with patient. Patient requesting lab results from 07/11/16, advised results not available, will call once resulted and reviewed by provider. Patient states she started cream as prescribed, was unclear if she was to start diflucan now or wait for results. Advised patient would review with Ria CommentPatricia Grubb, NP and return call, patient request detailed message be left on voicemail.    Ria CommentPatricia Grubb, NP-is patient to start diflucan now or wait for wet prep results?

## 2016-07-13 LAB — VAGINITIS/VAGINOSIS, DNA PROBE
Candida Species: NEGATIVE
GARDNERELLA VAGINALIS: NEGATIVE
TRICHOMONAS VAG: NEGATIVE

## 2016-07-13 NOTE — Progress Notes (Signed)
Encounter reviewed Inocente Krach, MD   

## 2016-09-01 DIAGNOSIS — M7541 Impingement syndrome of right shoulder: Secondary | ICD-10-CM | POA: Insufficient documentation

## 2016-09-01 DIAGNOSIS — M542 Cervicalgia: Secondary | ICD-10-CM | POA: Insufficient documentation

## 2017-01-18 DIAGNOSIS — M546 Pain in thoracic spine: Secondary | ICD-10-CM | POA: Diagnosis not present

## 2017-01-18 DIAGNOSIS — E782 Mixed hyperlipidemia: Secondary | ICD-10-CM | POA: Diagnosis not present

## 2017-01-18 DIAGNOSIS — Z23 Encounter for immunization: Secondary | ICD-10-CM | POA: Diagnosis not present

## 2017-05-23 ENCOUNTER — Other Ambulatory Visit: Payer: Self-pay | Admitting: Family Medicine

## 2017-05-23 DIAGNOSIS — Z1231 Encounter for screening mammogram for malignant neoplasm of breast: Secondary | ICD-10-CM

## 2017-06-05 ENCOUNTER — Encounter: Payer: Self-pay | Admitting: Obstetrics and Gynecology

## 2017-06-05 ENCOUNTER — Ambulatory Visit: Payer: BLUE CROSS/BLUE SHIELD | Admitting: Obstetrics and Gynecology

## 2017-06-05 ENCOUNTER — Other Ambulatory Visit: Payer: Self-pay

## 2017-06-05 ENCOUNTER — Other Ambulatory Visit (HOSPITAL_COMMUNITY)
Admission: RE | Admit: 2017-06-05 | Discharge: 2017-06-05 | Disposition: A | Discharge status: Home / Self Care | Payer: BLUE CROSS/BLUE SHIELD | Source: Ambulatory Visit | Attending: Obstetrics and Gynecology | Admitting: Obstetrics and Gynecology

## 2017-06-05 VITALS — BP 118/72 | HR 64 | Temp 99.2°F | Resp 14 | Ht 66.5 in | Wt 184.0 lb

## 2017-06-05 DIAGNOSIS — Z124 Encounter for screening for malignant neoplasm of cervix: Secondary | ICD-10-CM | POA: Insufficient documentation

## 2017-06-05 DIAGNOSIS — Z01419 Encounter for gynecological examination (general) (routine) without abnormal findings: Secondary | ICD-10-CM

## 2017-06-05 NOTE — Patient Instructions (Signed)
EXERCISE AND DIET:  We recommended that you start or continue a regular exercise program for good health. Regular exercise means any activity that makes your heart beat faster and makes you sweat.  We recommend exercising at least 30 minutes per day at least 3 days a week, preferably 4 or 5.  We also recommend a diet low in fat and sugar.  Inactivity, poor dietary choices and obesity can cause diabetes, heart attack, stroke, and kidney damage, among others.    ALCOHOL AND SMOKING:  Women should limit their alcohol intake to no more than 7 drinks/beers/glasses of wine (combined, not each!) per week. Moderation of alcohol intake to this level decreases your risk of breast cancer and liver damage. And of course, no recreational drugs are part of a healthy lifestyle.  And absolutely no smoking or even second hand smoke. Most people know smoking can cause heart and lung diseases, but did you know it also contributes to weakening of your bones? Aging of your skin?  Yellowing of your teeth and nails?  CALCIUM AND VITAMIN D:  Adequate intake of calcium and Vitamin D are recommended.  The recommendations for exact amounts of these supplements seem to change often, but generally speaking 600 mg of calcium (either carbonate or citrate) and 800 units of Vitamin D per day seems prudent. Certain women may benefit from higher intake of Vitamin D.  If you are among these women, your doctor will have told you during your visit.    PAP SMEARS:  Pap smears, to check for cervical cancer or precancers,  have traditionally been done yearly, although recent scientific advances have shown that most women can have pap smears less often.  However, every woman still should have a physical exam from her gynecologist every year. It will include a breast check, inspection of the vulva and vagina to check for abnormal growths or skin changes, a visual exam of the cervix, and then an exam to evaluate the size and shape of the uterus and  ovaries.  And after 61 years of age, a rectal exam is indicated to check for rectal cancers. We will also provide age appropriate advice regarding health maintenance, like when you should have certain vaccines, screening for sexually transmitted diseases, bone density testing, colonoscopy, mammograms, etc.   MAMMOGRAMS:  All women over 40 years old should have a yearly mammogram. Many facilities now offer a "3D" mammogram, which may cost around $50 extra out of pocket. If possible,  we recommend you accept the option to have the 3D mammogram performed.  It both reduces the number of women who will be called back for extra views which then turn out to be normal, and it is better than the routine mammogram at detecting truly abnormal areas.    COLONOSCOPY:  Colonoscopy to screen for colon cancer is recommended for all women at age 50.  We know, you hate the idea of the prep.  We agree, BUT, having colon cancer and not knowing it is worse!!  Colon cancer so often starts as a polyp that can be seen and removed at colonscopy, which can quite literally save your life!  And if your first colonoscopy is normal and you have no family history of colon cancer, most women don't have to have it again for 10 years.  Once every ten years, you can do something that may end up saving your life, right?  We will be happy to help you get it scheduled when you are ready.    Be sure to check your insurance coverage so you understand how much it will cost.  It may be covered as a preventative service at no cost, but you should check your particular policy.      Breast Self-Awareness Breast self-awareness means being familiar with how your breasts look and feel. It involves checking your breasts regularly and reporting any changes to your health care provider. Practicing breast self-awareness is important. A change in your breasts can be a sign of a serious medical problem. Being familiar with how your breasts look and feel allows  you to find any problems early, when treatment is more likely to be successful. All women should practice breast self-awareness, including women who have had breast implants. How to do a breast self-exam One way to learn what is normal for your breasts and whether your breasts are changing is to do a breast self-exam. To do a breast self-exam: Look for Changes  1. Remove all the clothing above your waist. 2. Stand in front of a mirror in a room with good lighting. 3. Put your hands on your hips. 4. Push your hands firmly downward. 5. Compare your breasts in the mirror. Look for differences between them (asymmetry), such as: ? Differences in shape. ? Differences in size. ? Puckers, dips, and bumps in one breast and not the other. 6. Look at each breast for changes in your skin, such as: ? Redness. ? Scaly areas. 7. Look for changes in your nipples, such as: ? Discharge. ? Bleeding. ? Dimpling. ? Redness. ? A change in position. Feel for Changes  Carefully feel your breasts for lumps and changes. It is best to do this while lying on your back on the floor and again while sitting or standing in the shower or tub with soapy water on your skin. Feel each breast in the following way:  Place the arm on the side of the breast you are examining above your head.  Feel your breast with the other hand.  Start in the nipple area and make  inch (2 cm) overlapping circles to feel your breast. Use the pads of your three middle fingers to do this. Apply light pressure, then medium pressure, then firm pressure. The light pressure will allow you to feel the tissue closest to the skin. The medium pressure will allow you to feel the tissue that is a little deeper. The firm pressure will allow you to feel the tissue close to the ribs.  Continue the overlapping circles, moving downward over the breast until you feel your ribs below your breast.  Move one finger-width toward the center of the body.  Continue to use the  inch (2 cm) overlapping circles to feel your breast as you move slowly up toward your collarbone.  Continue the up and down exam using all three pressures until you reach your armpit.  Write Down What You Find  Write down what is normal for each breast and any changes that you find. Keep a written record with breast changes or normal findings for each breast. By writing this information down, you do not need to depend only on memory for size, tenderness, or location. Write down where you are in your menstrual cycle, if you are still menstruating. If you are having trouble noticing differences in your breasts, do not get discouraged. With time you will become more familiar with the variations in your breasts and more comfortable with the exam. How often should I examine my breasts? Examine   your breasts every month. If you are breastfeeding, the best time to examine your breasts is after a feeding or after using a breast pump. If you menstruate, the best time to examine your breasts is 5-7 days after your period is over. During your period, your breasts are lumpier, and it may be more difficult to notice changes. When should I see my health care provider? See your health care provider if you notice:  A change in shape or size of your breasts or nipples.  A change in the skin of your breast or nipples, such as a reddened or scaly area.  Unusual discharge from your nipples.  A lump or thick area that was not there before.  Pain in your breasts.  Anything that concerns you.  This information is not intended to replace advice given to you by your health care provider. Make sure you discuss any questions you have with your health care provider. Document Released: 01/24/2005 Document Revised: 07/02/2015 Document Reviewed: 12/14/2014 Elsevier Interactive Patient Education  2018 [employer]  

## 2017-06-05 NOTE — Progress Notes (Signed)
62 y.o. [id number] MarriedCaucasianF here for annual exam.   She is in a transition to a vegetable based diet, she has noted more bloating since then. She has been having 4-5 normal BM's a day on this diet as well. She has put on some weight since going off of her adderall. No vaginal bleeding. Sexually active, does okay with lubrication.  She has had a hard time with allergies this year.      Patient's last menstrual period was 02/07/2009 (approximate).          Sexually active: Yes.    The current method of family planning is post menopausal status.    Exercising: No.  The patient does not participate in regular exercise at present. Smoker:  no  Health Maintenance: Pap:  04-17-14 WNL  History of abnormal Pap:  no MMG:  06-09-16 WNL  Colonoscopy:  07/2015 polyps, f/u in 5 years  BMD:   06-30-16 Osteopenia  TDaP:  05-08-13 Gardasil: N/A   reports that she has never smoked. She has never used smokeless tobacco. She reports that she drinks about 3.0 oz of alcohol per week. She reports that she does not use drugs. Retired. 3 kids and 3 grand children. 2 kids and 2 grand kids are local.   Past Medical History:  Diagnosis Date  . Blood transfusion without reported diagnosis age 52   lac artery and right sciatic nerve  . Fibrocystic breast 10/98  . Injury of right sciatic nerve 1971   w/laceration of artery and nerve, blood transfusion, fell through plate glass door  . Kidney stone 11/10   stent, lithotripsy  . Vitamin D deficiency     Past Surgical History:  Procedure Laterality Date  . CESAREAN SECTION     X 3  . LITHOTRIPSY  12/2008  . multiple laceration  age 89   contusion and laceration of iliac artery , abdomen, right leg and elbow  . ORIF FINGER FRACTURE Left 2013 ?    Current Outpatient Medications  Medication Sig Dispense Refill  . acetaminophen (TYLENOL) 500 MG tablet Take 200 mg by mouth 3 (three) times daily. Take 2 tablets three times a day    . ALPRAZolam (XANAX) 0.5 MG  tablet Take 0.5 mg by mouth 3 (three) times daily as needed for anxiety.    Marland Kitchen atorvastatin (LIPITOR) 20 MG tablet Take 20 mg by mouth daily.    Marland Kitchen buPROPion (WELLBUTRIN SR) 150 MG 12 hr tablet Take 150 mg by mouth 2 (two) times daily.     . cetirizine (ZYRTEC) 10 MG tablet Take 10 mg by mouth daily.    . Lutein 20 MG CAPS     . Vitamin D, Ergocalciferol, (DRISDOL) 50000 units CAPS capsule Take 1 capsule (50,000 Units total) by mouth every 7 (seven) days. 30 capsule 3  . montelukast (SINGULAIR) 10 MG tablet TAKE 1 TABLET EVERY DAY AS NEEDED FOR ALLERGIES OR ASTHMA  12  . Olopatadine HCl 0.2 % SOLN      No current facility-administered medications for this visit.     Family History  Problem Relation Age of Onset  . Heart disease Mother   . Hypertension Mother   . Pulmonary embolism Mother   . Heart disease Father   . Diabetes Father   . Hypertension Father   . Heart attack Father   . Parkinson's disease Father   . Heart disease Maternal Grandmother   . Heart attack Maternal Grandmother   . Multiple sclerosis Maternal Grandfather   .  Diabetes Brother   . Heart disease Brother   . Diabetes Brother   . Multiple births Paternal Aunt        had twins  . Breast cancer Paternal Grandmother   . Hypertension Paternal Grandmother   . Hypertension Paternal Grandfather   One brother died in 12/19/2022, he had diabetes and heart disease. He died of CNS vasculitis.   Review of Systems  Constitutional: Negative.        Weight gain   HENT: Negative.   Eyes: Negative.   Respiratory: Negative.   Cardiovascular: Negative.   Gastrointestinal: Negative.        Bloating  Endocrine: Negative.   Genitourinary: Negative.   Musculoskeletal: Positive for myalgias.  Skin: Negative.   Allergic/Immunologic: Negative.   Neurological: Negative.   Psychiatric/Behavioral: Negative.     Exam:   BP 118/72 (BP Location: Right Arm, Patient Position: Sitting, Cuff Size: Normal)   Pulse 64   Temp 99.2 F  (37.3 C)   Resp 14   Ht 5' 6.5" (1.689 m)   Wt 184 lb (83.5 kg)   LMP 02/07/2009 (Approximate)   BMI 29.25 kg/m   Weight change: @ Height:   Height: 5' 6.5" (168.9 cm)  Ht Readings from Last 3 Encounters:  06/05/17 5' 6.5" (1.689 m)  07/11/16 5' 7.75" (1.721 m)  05/23/16 5' 7.75" (1.721 m)    General appearance: alert, cooperative and appears stated age Head: Normocephalic, without obvious abnormality, atraumatic Neck: no adenopathy, supple, symmetrical, trachea midline and thyroid normal to inspection and palpation Lungs: clear to auscultation bilaterally Cardiovascular: regular rate and rhythm Breasts: normal appearance, no masses or tenderness Abdomen: soft, non-tender; non distended,  no masses,  no organomegaly Extremities: extremities normal, atraumatic, no cyanosis or edema Skin: Skin color, texture, turgor normal. No rashes or lesions Lymph nodes: Cervical, supraclavicular, and axillary nodes normal. No abnormal inguinal nodes palpated Neurologic: Grossly normal   Pelvic: External genitalia:  no lesions              Urethra:  normal appearing urethra with no masses, tenderness or lesions              Bartholins and Skenes: normal                 Vagina: normal appearing vagina with normal color and discharge, no lesions              Cervix: no lesions               Bimanual Exam:  Uterus:  normal size, contour, position, consistency, mobility, non-tender              Adnexa: no mass, fullness, tenderness               Rectovaginal: Confirms               Anus:  normal sphincter tone, no lesions  Chaperone was present for exam.  A:  Well Woman with normal exam  P:   Pap with hpv  Discussed breast self exam  Discussed calcium and vit D intake  Colonoscopy UTD  Mammogram 5/19  Labs with primary

## 2017-06-06 LAB — CYTOLOGY - PAP
DIAGNOSIS: NEGATIVE
HPV: NOT DETECTED

## 2017-06-16 ENCOUNTER — Ambulatory Visit
Admission: RE | Admit: 2017-06-16 | Discharge: 2017-06-16 | Disposition: A | Discharge status: Home / Self Care | Payer: BLUE CROSS/BLUE SHIELD | Source: Ambulatory Visit | Attending: Family Medicine | Admitting: Family Medicine

## 2017-06-16 DIAGNOSIS — Z1231 Encounter for screening mammogram for malignant neoplasm of breast: Secondary | ICD-10-CM

## 2017-08-31 ENCOUNTER — Encounter: Payer: Self-pay | Admitting: Sports Medicine

## 2017-08-31 ENCOUNTER — Ambulatory Visit (INDEPENDENT_AMBULATORY_CARE_PROVIDER_SITE_OTHER): Payer: Commercial Managed Care - PPO | Admitting: Sports Medicine

## 2017-08-31 VITALS — BP 118/84 | Ht 66.0 in | Wt 175.0 lb

## 2017-08-31 DIAGNOSIS — F419 Anxiety disorder, unspecified: Secondary | ICD-10-CM | POA: Insufficient documentation

## 2017-08-31 DIAGNOSIS — F900 Attention-deficit hyperactivity disorder, predominantly inattentive type: Secondary | ICD-10-CM | POA: Insufficient documentation

## 2017-08-31 DIAGNOSIS — M1990 Unspecified osteoarthritis, unspecified site: Secondary | ICD-10-CM | POA: Insufficient documentation

## 2017-08-31 DIAGNOSIS — G4700 Insomnia, unspecified: Secondary | ICD-10-CM | POA: Insufficient documentation

## 2017-08-31 DIAGNOSIS — B078 Other viral warts: Secondary | ICD-10-CM | POA: Insufficient documentation

## 2017-08-31 DIAGNOSIS — E782 Mixed hyperlipidemia: Secondary | ICD-10-CM | POA: Insufficient documentation

## 2017-08-31 DIAGNOSIS — M25562 Pain in left knee: Secondary | ICD-10-CM | POA: Diagnosis not present

## 2017-08-31 DIAGNOSIS — E559 Vitamin D deficiency, unspecified: Secondary | ICD-10-CM | POA: Insufficient documentation

## 2017-08-31 NOTE — Progress Notes (Signed)
  April KelchSally H Silva - 62 y.o. female MRN [id number]  Date of birth: 07/18/1955    SUBJECTIVE:      Chief Complaint:/ HPI:  Patient 62-year-old female with 1 week of left knee pain and swelling.  She states she was hiking and letter that they started to experience knee pain.  She denies any fall or trip.  She denies feeling any pop.  She states her pain feels like it is deep within her knee and describes as feeling like a tightness.  Her pain is worse when standing up from a seated position.  Improved with rest.  She has not tried any medications or ice.  No prior injuries to her knee.  She denies any fevers, chills, erythema, numbness, or tingling.   ROS:     See HPI  PERTINENT  PMH / PSH FH / / SH:  Past Medical, Surgical, Social, and Family History Reviewed & Updated in the EMR.  Pertinent findings include:  Patient denies smoking.  OBJECTIVE: BP 118/84   Ht 5\' 6"  (1.676 m)   Wt 175 lb (79.4 kg)   LMP 02/07/2009 (Approximate)   BMI 28.25 kg/m   Physical Exam:  Vital signs are reviewed.  GEN: Alert and oriented, NAD HEENT: [place]/AC PSY: normal mood, congruent affect  MSK: Left Knee: - Inspection: no gross deformity, erythema or bruising.  There is a mild effusion.  This was visualized on ultrasound. - Palpation: no TTP - ROM: full active ROM with flexion and extension in knee and hip - Strength: full strength on resisted flexion and extension - Neuro/vasc: Normal sensation - Special Tests: - LIGAMENTS: negative anterior and posterior drawer, negative Lachman's, no MCL or LCL laxity  -- MENISCUS: negative McMurray's, negative Thessaly  -- PF JOINT: nml patellar mobility bilaterally.  negative patellar grind  Right Knee: - Inspection: no gross deformity, swelling or eythema is noted - Palpation: no TTP - ROM: full active ROM with flexion and extension in knee and hip - Strength: full strength on resisted flexion and extension - Neuro/vasc: Normal sensation - Special Tests: -  LIGAMENTS: negative anterior and posterior drawer, negative Lachman's, no MCL or LCL laxity  -- MENISCUS: negative McMurray's -- PF JOINT: nml patellar mobility bilaterally.  negative patellar grind    Bilateral Hips: normal ROM,  Negative logroll   ASSESSMENT & PLAN:  1.  Left knee pain. -Suspect likely due to a flare of osteo-arthritis.  On exam there is no concerning findings for ligamentous or meniscal pathology. -NSAIDs-recommended prescription dosing of either Aleve or ibuprofen. -Also recommended compression knee sleeve.  Patient believes she has at home she will attempt to find this if she is unable to she may contact the clinic. -I discussed further imaging with x-rays with the patient.  She declined x-rays at this time.  However if her pain is not improving in about 2 weeks recommend obtaining x-rays at that time. -Patient was instructed on isometric quadricep strengthening today. -Follow-up in 2 weeks if no improvement

## 2017-08-31 NOTE — Addendum Note (Signed)
Addended by: Rutha BouchardBABNIK, Dez Stauffer E on: 08/31/2017 01:33 PM   Modules accepted: Orders

## 2017-08-31 NOTE — Patient Instructions (Signed)
Your knee pain is likely a flare of arthritis.  Recommend taking ibuprofen up to 800 mg 3 times daily.  You were provided some exercises to perform at home for strengthening of the quadricep.  As we discussed, x-rays are not absolutely needed right now however if you are not improving about 2 weeks we will reconsider this and have you return to clinic.

## 2017-09-19 ENCOUNTER — Ambulatory Visit: Payer: BLUE CROSS/BLUE SHIELD | Admitting: Sports Medicine

## 2017-09-20 DIAGNOSIS — E782 Mixed hyperlipidemia: Secondary | ICD-10-CM | POA: Diagnosis not present

## 2017-09-20 DIAGNOSIS — E559 Vitamin D deficiency, unspecified: Secondary | ICD-10-CM | POA: Diagnosis not present

## 2018-01-16 DIAGNOSIS — E782 Mixed hyperlipidemia: Secondary | ICD-10-CM | POA: Diagnosis not present

## 2018-02-23 DIAGNOSIS — E782 Mixed hyperlipidemia: Secondary | ICD-10-CM | POA: Diagnosis not present

## 2018-06-13 ENCOUNTER — Ambulatory Visit: Payer: BLUE CROSS/BLUE SHIELD | Admitting: Obstetrics and Gynecology

## 2018-08-20 ENCOUNTER — Other Ambulatory Visit: Payer: Self-pay

## 2018-08-20 ENCOUNTER — Other Ambulatory Visit: Payer: Self-pay | Admitting: Obstetrics and Gynecology

## 2018-08-20 DIAGNOSIS — Z1231 Encounter for screening mammogram for malignant neoplasm of breast: Secondary | ICD-10-CM

## 2018-08-20 NOTE — Progress Notes (Signed)
63 y.o. [id number] Married White or Caucasian Not Hispanic or Latino female here for annual exam.  No vaginal bleeding. Sexually active, no pain.     Patient's last menstrual period was 02/07/2009 (approximate).          Sexually active: Yes.    The current method of family planning is post menopausal status.    Exercising: No.  The patient does not participate in regular exercise at present. In PT for knee pain. Smoker:  no  Health Maintenance: Pap:  2/29/2019 WNL NEG HPV, 04-17-14 WNL  History of abnormal Pap:  no MMG:  06/16/2017 Birads 1 negative Colonoscopy:  07/2015 polyps, f/u in 5 years  BMD:   06-30-16 Osteopenia, T score -1.8, FRAX 8.9/1%  TDaP:  05-08-13 Gardasil: N/A   reports that she has never smoked. She has never used smokeless tobacco. She reports current alcohol use of about 5.0 standard drinks of alcohol per week. She reports that she does not use drugs. Retired. 3 daughters and 3 grand children, one on the way.  Past Medical History:  Diagnosis Date  . Blood transfusion without reported diagnosis age 59   lac artery and right sciatic nerve  . Fibrocystic breast 10/98  . Injury of right sciatic nerve 1971   w/laceration of artery and nerve, blood transfusion, fell through plate glass door  . Kidney stone 11/10   stent, lithotripsy  . Vitamin D deficiency     Past Surgical History:  Procedure Laterality Date  . CESAREAN SECTION     X 3  . LITHOTRIPSY  12/2008  . multiple laceration  age 85   contusion and laceration of iliac artery , abdomen, right leg and elbow  . ORIF FINGER FRACTURE Left 2013 ?    Current Outpatient Medications  Medication Sig Dispense Refill  . ALPRAZolam (XANAX) 0.5 MG tablet Take 0.5 mg by mouth 3 (three) times daily as needed for anxiety.    Marland Kitchen atorvastatin (LIPITOR) 10 MG tablet Take 10 mg by mouth daily.    Marland Kitchen buPROPion (WELLBUTRIN SR) 150 MG 12 hr tablet Take 150 mg by mouth 2 (two) times daily.     . cetirizine (ZYRTEC) 10 MG tablet  Take 10 mg by mouth daily.    . diclofenac (VOLTAREN) 75 MG EC tablet TK 1 T PO  BID WITH FOOD    . Lutein 20 MG CAPS     . Melatonin-Pyridoxine (MELATONEX PO) Take 10 mg by mouth at bedtime.    . montelukast (SINGULAIR) 10 MG tablet TAKE 1 TABLET EVERY DAY AS NEEDED FOR ALLERGIES OR ASTHMA  12  . Vitamin D, Ergocalciferol, (DRISDOL) 1.25 MG (50000 UT) CAPS capsule 1 CAPSULE TWICE A WEEK ORALLY     No current facility-administered medications for this visit.     Family History  Problem Relation Age of Onset  . Heart disease Mother   . Hypertension Mother   . Pulmonary embolism Mother   . Heart disease Father   . Diabetes Father   . Hypertension Father   . Heart attack Father   . Parkinson's disease Father   . Heart disease Maternal Grandmother   . Heart attack Maternal Grandmother   . Multiple sclerosis Maternal Grandfather   . Diabetes Brother   . Heart disease Brother   . Diabetes Brother   . Multiple births Paternal Aunt        had twins  . Breast cancer Paternal Grandmother   . Hypertension Paternal Grandmother   .  Hypertension Paternal Grandfather     Review of Systems  Constitutional: Negative.   HENT: Negative.   Eyes: Negative.   Respiratory: Negative.   Cardiovascular: Negative.   Gastrointestinal: Negative.   Endocrine: Negative.   Genitourinary: Negative.   Musculoskeletal: Negative.   Allergic/Immunologic: Negative.   Neurological: Negative.   Hematological: Negative.   Psychiatric/Behavioral: Negative.     Exam:   BP 122/70 (BP Location: Right Arm, Patient Position: Sitting, Cuff Size: Normal)   Pulse 66   Temp 97.7 F (36.5 C) (Temporal)   Resp 14   Ht 5' 6.75" (1.695 m)   Wt 184 lb 4 oz (83.6 kg)   LMP 02/07/2009 (Approximate)   BMI 29.07 kg/m   Weight change: @WEIGHTCHANGE @ Height:   Height: 5' 6.75" (169.5 cm)  Ht Readings from Last 3 Encounters:  08/22/18 5' 6.75" (1.695 m)  08/31/17 5\' 6"  (1.676 m)  06/05/17 5' 6.5" (1.689 m)     General appearance: alert, cooperative and appears stated age Head: Normocephalic, without obvious abnormality, atraumatic Neck: no adenopathy, supple, symmetrical, trachea midline and thyroid normal to inspection and palpation Lungs: clear to auscultation bilaterally Cardiovascular: regular rate and rhythm Breasts: normal appearance, no masses or tenderness Abdomen: soft, non-tender; non distended,  no masses,  no organomegaly Extremities: extremities normal, atraumatic, no cyanosis or edema Skin: Skin color, texture, turgor normal. No rashes or lesions Lymph nodes: Cervical, supraclavicular, and axillary nodes normal. No abnormal inguinal nodes palpated Neurologic: Grossly normal   Pelvic: External genitalia:  no lesions              Urethra:  normal appearing urethra with no masses, tenderness or lesions              Bartholins and Skenes: normal                 Vagina: atrophic appearing vagina with normal color and discharge, no lesions              Cervix: no lesions               Bimanual Exam:  Uterus:  normal size, contour, position, consistency, mobility, non-tender              Adnexa: no mass, fullness, tenderness               Rectovaginal: Confirms               Anus:  normal sphincter tone, no lesions  Chaperone was present for exam.  A:  Well Woman with normal exam  Osteopenia  P:   DEXA ordered  Mammogram due  Colonoscopy UTD  No pap this year  Discussed breast self exam  Discussed calcium and vit D intake  Labs with primary

## 2018-08-22 ENCOUNTER — Other Ambulatory Visit: Payer: Self-pay

## 2018-08-22 ENCOUNTER — Encounter: Payer: Self-pay | Admitting: Obstetrics and Gynecology

## 2018-08-22 ENCOUNTER — Ambulatory Visit (INDEPENDENT_AMBULATORY_CARE_PROVIDER_SITE_OTHER): Payer: Commercial Managed Care - PPO | Admitting: Obstetrics and Gynecology

## 2018-08-22 VITALS — BP 122/70 | HR 66 | Temp 97.7°F | Resp 14 | Ht 66.75 in | Wt 184.2 lb

## 2018-08-22 DIAGNOSIS — Z01419 Encounter for gynecological examination (general) (routine) without abnormal findings: Secondary | ICD-10-CM | POA: Diagnosis not present

## 2018-08-22 DIAGNOSIS — E2839 Other primary ovarian failure: Secondary | ICD-10-CM | POA: Diagnosis not present

## 2018-08-22 DIAGNOSIS — Z8739 Personal history of other diseases of the musculoskeletal system and connective tissue: Secondary | ICD-10-CM

## 2018-08-22 NOTE — Patient Instructions (Signed)
EXERCISE AND DIET:  We recommended that you start or continue a regular exercise program for good health. Regular exercise means any activity that makes your heart beat faster and makes you sweat.  We recommend exercising at least 30 minutes per day at least 3 days a week, preferably 4 or 5.  We also recommend a diet low in fat and sugar.  Inactivity, poor dietary choices and obesity can cause diabetes, heart attack, stroke, and kidney damage, among others.   ° °ALCOHOL AND SMOKING:  Women should limit their alcohol intake to no more than 7 drinks/beers/glasses of wine (combined, not each!) per week. Moderation of alcohol intake to this level decreases your risk of breast cancer and liver damage. And of course, no recreational drugs are part of a healthy lifestyle.  And absolutely no smoking or even second hand smoke. Most people know smoking can cause heart and lung diseases, but did you know it also contributes to weakening of your bones? Aging of your skin?  Yellowing of your teeth and nails? ° °CALCIUM AND VITAMIN D:  Adequate intake of calcium and Vitamin D are recommended.  The recommendations for exact amounts of these supplements seem to change often, but generally speaking 1,200 mg of calcium (between diet and supplement) and 800 units of Vitamin D per day seems prudent. Certain women may benefit from higher intake of Vitamin D.  If you are among these women, your doctor will have told you during your visit.   ° °PAP SMEARS:  Pap smears, to check for cervical cancer or precancers,  have traditionally been done yearly, although recent scientific advances have shown that most women can have pap smears less often.  However, every woman still should have a physical exam from her gynecologist every year. It will include a breast check, inspection of the vulva and vagina to check for abnormal growths or skin changes, a visual exam of the cervix, and then an exam to evaluate the size and shape of the uterus and  ovaries.  And after 62 years of age, a rectal exam is indicated to check for rectal cancers. We will also provide age appropriate advice regarding health maintenance, like when you should have certain vaccines, screening for sexually transmitted diseases, bone density testing, colonoscopy, mammograms, etc.  ° °MAMMOGRAMS:  All women over 40 years old should have a yearly mammogram. Many facilities now offer a "3D" mammogram, which may cost around $50 extra out of pocket. If possible,  we recommend you accept the option to have the 3D mammogram performed.  It both reduces the number of women who will be called back for extra views which then turn out to be normal, and it is better than the routine mammogram at detecting truly abnormal areas.   ° °COLON CANCER SCREENING: Now recommend starting at age 45. At this time colonoscopy is not covered for routine screening until 50. There are take home tests that can be done between 45-49.  ° °COLONOSCOPY:  Colonoscopy to screen for colon cancer is recommended for all women at age 50.  We know, you hate the idea of the prep.  We agree, BUT, having colon cancer and not knowing it is worse!!  Colon cancer so often starts as a polyp that can be seen and removed at colonscopy, which can quite literally save your life!  And if your first colonoscopy is normal and you have no family history of colon cancer, most women don't have to have it again for   10 years.  Once every ten years, you can do something that may end up saving your life, right?  We will be happy to help you get it scheduled when you are ready.  Be sure to check your insurance coverage so you understand how much it will cost.  It may be covered as a preventative service at no cost, but you should check your particular policy.   ° ° ° °Breast Self-Awareness °Breast self-awareness means being familiar with how your breasts look and feel. It involves checking your breasts regularly and reporting any changes to your  health care provider. °Practicing breast self-awareness is important. A change in your breasts can be a sign of a serious medical problem. Being familiar with how your breasts look and feel allows you to find any problems early, when treatment is more likely to be successful. All women should practice breast self-awareness, including women who have had breast implants. °How to do a breast self-exam °One way to learn what is normal for your breasts and whether your breasts are changing is to do a breast self-exam. To do a breast self-exam: °Look for Changes ° °1. Remove all the clothing above your waist. °2. Stand in front of a mirror in a room with good lighting. °3. Put your hands on your hips. °4. Push your hands firmly downward. °5. Compare your breasts in the mirror. Look for differences between them (asymmetry), such as: °? Differences in shape. °? Differences in size. °? Puckers, dips, and bumps in one breast and not the other. °6. Look at each breast for changes in your skin, such as: °? Redness. °? Scaly areas. °7. Look for changes in your nipples, such as: °? Discharge. °? Bleeding. °? Dimpling. °? Redness. °? A change in position. °Feel for Changes °Carefully feel your breasts for lumps and changes. It is best to do this while lying on your back on the floor and again while sitting or standing in the shower or tub with soapy water on your skin. Feel each breast in the following way: °· Place the arm on the side of the breast you are examining above your head. °· Feel your breast with the other hand. °· Start in the nipple area and make ¾ inch (2 cm) overlapping circles to feel your breast. Use the pads of your three middle fingers to do this. Apply light pressure, then medium pressure, then firm pressure. The light pressure will allow you to feel the tissue closest to the skin. The medium pressure will allow you to feel the tissue that is a little deeper. The firm pressure will allow you to feel the tissue  close to the ribs. °· Continue the overlapping circles, moving downward over the breast until you feel your ribs below your breast. °· Move one finger-width toward the center of the body. Continue to use the ¾ inch (2 cm) overlapping circles to feel your breast as you move slowly up toward your collarbone. °· Continue the up and down exam using all three pressures until you reach your armpit. ° °Write Down What You Find ° °Write down what is normal for each breast and any changes that you find. Keep a written record with breast changes or normal findings for each breast. By writing this information down, you do not need to depend only on memory for size, tenderness, or location. Write down where you are in your menstrual cycle, if you are still menstruating. °If you are having trouble noticing differences   in your breasts, do not get discouraged. With time you will become more familiar with the variations in your breasts and more comfortable with the exam. °How often should I examine my breasts? °Examine your breasts every month. If you are breastfeeding, the best time to examine your breasts is after a feeding or after using a breast pump. If you menstruate, the best time to examine your breasts is 5-7 days after your period is over. During your period, your breasts are lumpier, and it may be more difficult to notice changes. °When should I see my health care provider? °See your health care provider if you notice: °· A change in shape or size of your breasts or nipples. °· A change in the skin of your breast or nipples, such as a reddened or scaly area. °· Unusual discharge from your nipples. °· A lump or thick area that was not there before. °· Pain in your breasts. °· Anything that concerns you. ° ° °Osteopenia ° °Osteopenia is a loss of thickness (density) inside of the bones. Another name for osteopenia is low bone mass. °Mild osteopenia is a normal part of aging. It is not a disease, and it does not cause  symptoms. However, if you have osteopenia and continue to lose bone mass, you could develop a condition that causes the bones to become thin and break more easily (osteoporosis). You may also lose some height, have back pain, and have a stooped posture. Although osteopenia is not a disease, making changes to your lifestyle and diet can help to prevent osteopenia from developing into osteoporosis. °What are the causes? °Osteopenia is caused by loss of calcium in the bones.  °Bones are constantly changing. Old bone cells are continually being replaced with new bone cells. This process builds new bone. The mineral calcium is needed to build new bone and maintain bone density. Bone density is usually highest around age 35. After that, most people's bodies cannot replace all the bone they have lost with new bone. °What increases the risk? °You are more likely to develop this condition if: °· You are older than age 50. °· You are a woman who went through menopause early. °· You have a long illness that keeps you in bed. °· You do not get enough exercise. °· You lack certain nutrients (malnutrition). °· You have an overactive thyroid gland (hyperthyroidism). °· You smoke. °· You drink a lot of alcohol. °· You are taking medicines that weaken the bones, such as steroids. °What are the signs or symptoms? °This condition does not cause any symptoms. You may have a slightly higher risk for bone breaks (fractures), so getting fractures more easily than normal may be an indication of osteopenia. °How is this diagnosed? °Your health care provider can diagnose this condition with a special type of X-ray exam that measures bone density (dual-energy X-ray absorptiometry, DEXA). This test can measure bone density in your hips, spine, and wrists. °Osteopenia has no symptoms, so this condition is usually diagnosed after a routine bone density screening test is done for osteoporosis. This routine screening is usually done for: °· Women  who are age 65 or older. °· Men who are age 70 or older. °If you have risk factors for osteopenia, you may have the screening test at an earlier age. °How is this treated? °Making dietary and lifestyle changes can lower your risk for osteoporosis. If you have severe osteopenia that is close to becoming osteoporosis, your health care provider may prescribe medicines   and dietary supplements such as calcium and vitamin D. These supplements help to rebuild bone density. °Follow these instructions at home: ° °· Take over-the-counter and prescription medicines only as told by your health care provider. These include vitamins and supplements. °· Eat a diet that is high in calcium and vitamin D. °? Calcium is found in dairy products, beans, salmon, and leafy green vegetables like spinach and broccoli. °? Look for foods that have vitamin D and calcium added to them (fortified foods), such as orange juice, cereal, and bread. °· Do 30 or more minutes of a weight-bearing exercise every day, such as walking, jogging, or playing a sport. These types of exercises strengthen the bones. °· Take precautions at home to lower your risk of falling, such as: °? Keeping rooms well-lit and free of clutter, such as cords. °? Installing safety rails on stairs. °? Using rubber mats in the bathroom or other areas that are often wet or slippery. °· Do not use any products that contain nicotine or tobacco, such as cigarettes and e-cigarettes. If you need help quitting, ask your health care provider. °· Avoid alcohol or limit alcohol intake to no more than 1 drink a day for nonpregnant women and 2 drinks a day for men. One drink equals 12 oz of beer, 5 oz of wine, or 1½ oz of hard liquor. °· Keep all follow-up visits as told by your health care provider. This is important. °Contact a health care provider if: °· You have not had a bone density screening for osteoporosis and you are: °? A woman, age 65 or older. °? A man, age 70 or older. °· You  are a postmenopausal woman who has not had a bone density screening for osteoporosis. °· You are older than age 50 and you want to know if you should have bone density screening for osteoporosis. °Summary °· Osteopenia is a loss of thickness (density) inside of the bones. Another name for osteopenia is low bone mass. °· Osteopenia is not a disease, but it may increase your risk for a condition that causes the bones to become thin and break more easily (osteoporosis). °· You may be at risk for osteopenia if you are older than age 50 or if you are a woman who went through early menopause. °· Osteopenia does not cause any symptoms, but it can be diagnosed with a bone density screening test. °· Dietary and lifestyle changes are the first treatment for osteopenia. These may lower your risk for osteoporosis. °This information is not intended to replace advice given to you by your health care provider. Make sure you discuss any questions you have with your health care provider. °Document Released: 11/02/2016 Document Revised: 01/06/2017 Document Reviewed: 11/02/2016 °Elsevier Patient Education © 2020 [employer] ° °

## 2018-09-12 ENCOUNTER — Other Ambulatory Visit: Payer: Self-pay | Admitting: Obstetrics and Gynecology

## 2018-09-12 DIAGNOSIS — Z8739 Personal history of other diseases of the musculoskeletal system and connective tissue: Secondary | ICD-10-CM

## 2018-09-12 DIAGNOSIS — E2839 Other primary ovarian failure: Secondary | ICD-10-CM

## 2018-10-02 ENCOUNTER — Ambulatory Visit: Payer: Commercial Managed Care - PPO

## 2018-11-09 ENCOUNTER — Other Ambulatory Visit: Payer: Self-pay

## 2018-11-09 ENCOUNTER — Ambulatory Visit (INDEPENDENT_AMBULATORY_CARE_PROVIDER_SITE_OTHER): Payer: Commercial Managed Care - PPO | Admitting: Obstetrics and Gynecology

## 2018-11-09 ENCOUNTER — Encounter: Payer: Self-pay | Admitting: Obstetrics and Gynecology

## 2018-11-09 VITALS — BP 130/80 | HR 64 | Temp 97.1°F | Wt 188.0 lb

## 2018-11-09 DIAGNOSIS — Z23 Encounter for immunization: Secondary | ICD-10-CM

## 2018-11-09 DIAGNOSIS — N3090 Cystitis, unspecified without hematuria: Secondary | ICD-10-CM

## 2018-11-09 DIAGNOSIS — R3915 Urgency of urination: Secondary | ICD-10-CM | POA: Diagnosis not present

## 2018-11-09 DIAGNOSIS — R350 Frequency of micturition: Secondary | ICD-10-CM

## 2018-11-09 DIAGNOSIS — R300 Dysuria: Secondary | ICD-10-CM | POA: Diagnosis not present

## 2018-11-09 LAB — POCT URINALYSIS DIPSTICK
Bilirubin, UA: NEGATIVE
Blood, UA: POSITIVE
Glucose, UA: NEGATIVE
Ketones, UA: NEGATIVE
Nitrite, UA: NEGATIVE
Protein, UA: NEGATIVE
Spec Grav, UA: 1.01 (ref 1.010–1.025)
Urobilinogen, UA: 0.2 E.U./dL
pH, UA: 5 (ref 5.0–8.0)

## 2018-11-09 MED ORDER — SULFAMETHOXAZOLE-TRIMETHOPRIM 800-160 MG PO TABS: 1.0000 | ORAL_TABLET | Freq: Two times a day (BID) | ORAL | 0 refills | Status: DC

## 2018-11-09 MED ORDER — PHENAZOPYRIDINE HCL 200 MG PO TABS: 200.0000 mg | ORAL_TABLET | Freq: Three times a day (TID) | ORAL | 0 refills | Status: DC | PRN

## 2018-11-09 NOTE — Progress Notes (Signed)
GYNECOLOGY  VISIT   HPI: 63 y.o.   Married White or Caucasian Not Hispanic or Latino  female   [phone] with Patient's last menstrual period was 02/07/2009 (approximate).   here for urinary frequency, urgency, and dysuria that began 2 days ago. No fever, no abdominal or back pain.    GYNECOLOGIC HISTORY: Patient's last menstrual period was 02/07/2009 (approximate). Contraception: Postmenopausal Menopausal hormone therapy: None       OB History    Gravida  3   Para  3   Term  3   Preterm  0   AB  0   Living  3     SAB  0   TAB  0   Ectopic  0   Multiple  0   Live Births  3              Patient Active Problem List   Diagnosis Date Noted  . 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 Anxiety 08/31/2017  . Arthritis 08/31/2017  . Avitaminosis D 08/31/2017  . Cannot sleep 08/31/2017  . 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 Common wart 08/31/2017  . Rotator cuff impingement syndrome of right shoulder 09/01/2016  . Neck pain 09/01/2016  . Adenopathy 04/27/2015  . Congestion of nasal sinus 04/27/2015  . Pain in joint, ankle and foot 06/18/2013  . Plantar fascia rupture 03/07/2012  . Depression 05/02/2011  . DEGENERATIVE JOINT DISEASE, CERVICAL SPINE 11/17/2009    Past Medical History:  Diagnosis Date  . Blood transfusion without reported diagnosis age 44   lac artery and right sciatic nerve  . Fibrocystic breast 10/98  . Injury of right sciatic nerve 1971   w/laceration of artery and nerve, blood transfusion, fell through plate glass door  . Kidney stone 11/10   stent, lithotripsy  . Vitamin D deficiency     Past Surgical History:  Procedure Laterality Date  . CESAREAN SECTION     X 3  . LITHOTRIPSY  12/2008  . multiple laceration  age 47   contusion and laceration of iliac artery , abdomen, right leg and elbow  . ORIF FINGER FRACTURE Left 2013 ?    Current Outpatient Medications   Medication Sig Dispense Refill  . atorvastatin (LIPITOR) 10 MG tablet Take 10 mg by mouth daily.    Marland Kitchen buPROPion (WELLBUTRIN SR) 150 MG 12 hr tablet Take 150 mg by mouth 2 (two) times daily.     . cetirizine (ZYRTEC) 10 MG tablet Take 10 mg by mouth daily.    . Melatonin-Pyridoxine (MELATONEX PO) Take 10 mg by mouth at bedtime.    . montelukast (SINGULAIR) 10 MG tablet TAKE 1 TABLET EVERY DAY AS NEEDED FOR ALLERGIES OR ASTHMA  12  . Vitamin D, Ergocalciferol, (DRISDOL) 1.25 MG (50000 UT) CAPS capsule 1 CAPSULE TWICE A WEEK ORALLY    . ALPRAZolam (XANAX) 0.5 MG tablet Take 0.5 mg by mouth 3 (three) times daily as needed for anxiety.    . Lutein 20 MG CAPS      No current facility-administered medications for this visit.      ALLERGIES: Demerol  [meperidine hcl] and Meperidine  Family History  Problem Relation Age of Onset  . Heart disease Mother   . Hypertension Mother   . Pulmonary embolism Mother   . Heart disease Father   . Diabetes Father   . Hypertension Father   . Heart attack Father   . Parkinson's disease Father   .  Heart disease Maternal Grandmother   . Heart attack Maternal Grandmother   . Multiple sclerosis Maternal Grandfather   . Diabetes Brother   . Heart disease Brother   . Diabetes Brother   . Multiple births Paternal Aunt        had twins  . Breast cancer Paternal Grandmother   . Hypertension Paternal Grandmother   . Hypertension Paternal Grandfather     Social History   Socioeconomic History  . Marital status: Married    Spouse name: Not on file  . Number of children: 3  . Years of education: Not on file  . Highest education level: Not on file  Occupational History    Employer: UNEMPLOYED  Social Needs  . Financial resource strain: Not on file  . Food insecurity    Worry: Not on file    Inability: Not on file  . Transportation needs    Medical: Not on file    Non-medical: Not on file  Tobacco Use  . Smoking status: Never Smoker  . Smokeless  tobacco: Never Used  Substance and Sexual Activity  . Alcohol use: Yes    Alcohol/week: 5.0 standard drinks    Types: 5 Standard drinks or equivalent per week  . Drug use: No  . Sexual activity: Yes    Partners: Male    Birth control/protection: Post-menopausal  Lifestyle  . Physical activity    Days per week: Not on file    Minutes per session: Not on file  . Stress: Not on file  Relationships  . Social Herbalist on phone: Not on file    Gets together: Not on file    Attends religious service: Not on file    Active member of club or organization: Not on file    Attends meetings of clubs or organizations: Not on file    Relationship status: Not on file  . Intimate partner violence    Fear of current or ex partner: Not on file    Emotionally abused: Not on file    Physically abused: Not on file    Forced sexual activity: Not on file  Other Topics Concern  . Not on file  Social History Narrative  . Not on file    Review of Systems  Constitutional: Negative.   HENT: Negative.   Eyes: Negative.   Respiratory: Negative.   Cardiovascular: Negative.   Gastrointestinal: Negative.   Genitourinary: Positive for dysuria, frequency and urgency.  Musculoskeletal: Negative.   Skin: Negative.   Neurological: Negative.   Endo/Heme/Allergies: Negative.   Psychiatric/Behavioral: Negative.     PHYSICAL EXAMINATION:    BP 130/80 (BP Location: Right Arm, Patient Position: Sitting, Cuff Size: Normal)   Pulse 64   Temp (!) 97.1 F (36.2 C) (Temporal)   Wt 188 lb (85.3 kg)   LMP 02/07/2009 (Approximate)   BMI 29.67 kg/m     General appearance: alert, cooperative and appears stated age Abdomen: soft, non-tender; non distended, no masses,  no organomegaly CVA: not tender  Urine dip: +blood, 2+leuk  ASSESSMENT Cystitis    PLAN Bactrim DS Pyridium Send urine for ua, c&s   An After Visit Summary was printed and given to the patient.

## 2018-11-09 NOTE — Patient Instructions (Signed)
Urinary Tract Infection, Adult A urinary tract infection (UTI) is an infection of any part of the urinary tract. The urinary tract includes:  The kidneys.  The ureters.  The bladder.  The urethra. These organs make, store, and get rid of pee (urine) in the body. What are the causes? This is caused by germs (bacteria) in your genital area. These germs grow and cause swelling (inflammation) of your urinary tract. What increases the risk? You are more likely to develop this condition if:  You have a small, thin tube (catheter) to drain pee.  You cannot control when you pee or poop (incontinence).  You are female, and: ? You use these methods to prevent pregnancy: ? A medicine that kills sperm (spermicide). ? A device that blocks sperm (diaphragm). ? You have low levels of a female hormone (estrogen). ? You are pregnant.  You have genes that add to your risk.  You are sexually active.  You take antibiotic medicines.  You have trouble peeing because of: ? A prostate that is bigger than normal, if you are female. ? A blockage in the part of your body that drains pee from the bladder (urethra). ? A kidney stone. ? A nerve condition that affects your bladder (neurogenic bladder). ? Not getting enough to drink. ? Not peeing often enough.  You have other conditions, such as: ? Diabetes. ? A weak disease-fighting system (immune system). ? Sickle cell disease. ? Gout. ? Injury of the spine. What are the signs or symptoms? Symptoms of this condition include:  Needing to pee right away (urgently).  Peeing often.  Peeing small amounts often.  Pain or burning when peeing.  Blood in the pee.  Pee that smells bad or not like normal.  Trouble peeing.  Pee that is cloudy.  Fluid coming from the vagina, if you are female.  Pain in the belly or lower back. Other symptoms include:  Throwing up (vomiting).  No urge to eat.  Feeling mixed up (confused).  Being tired  and grouchy (irritable).  A fever.  Watery poop (diarrhea). How is this treated? This condition may be treated with:  Antibiotic medicine.  Other medicines.  Drinking enough water. Follow these instructions at home:  Medicines  Take over-the-counter and prescription medicines only as told by your doctor.  If you were prescribed an antibiotic medicine, take it as told by your doctor. Do not stop taking it even if you start to feel better. General instructions  Make sure you: ? Pee until your bladder is empty. ? Do not hold pee for a long time. ? Empty your bladder after sex. ? Wipe from front to back after pooping if you are a female. Use each tissue one time when you wipe.  Drink enough fluid to keep your pee pale yellow.  Keep all follow-up visits as told by your doctor. This is important. Contact a doctor if:  You do not get better after 1-2 days.  Your symptoms go away and then come back. Get help right away if:  You have very bad back pain.  You have very bad pain in your lower belly.  You have a fever.  You are sick to your stomach (nauseous).  You are throwing up. Summary  A urinary tract infection (UTI) is an infection of any part of the urinary tract.  This condition is caused by germs in your genital area.  There are many risk factors for a UTI. These include having a small, thin   tube to drain pee and not being able to control when you pee or poop.  Treatment includes antibiotic medicines for germs.  Drink enough fluid to keep your pee pale yellow. This information is not intended to replace advice given to you by your health care provider. Make sure you discuss any questions you have with your health care provider. Document Released: 07/13/2007 Document Revised: 01/11/2018 Document Reviewed: 08/03/2017 Elsevier Patient Education  2020 Elsevier Inc.  

## 2018-11-10 LAB — URINALYSIS, MICROSCOPIC ONLY
Casts: NONE SEEN /lpf
Epithelial Cells (non renal): NONE SEEN /hpf (ref 0–10)
WBC, UA: >30 /hpf — AB (ref 0–5)

## 2018-11-11 LAB — URINE CULTURE

## 2018-11-12 ENCOUNTER — Encounter: Payer: Self-pay | Admitting: Obstetrics and Gynecology

## 2018-11-12 ENCOUNTER — Telehealth: Payer: Self-pay

## 2018-11-12 NOTE — Telephone Encounter (Signed)
Left message to call Kaitlyn at [phone]. 

## 2018-11-12 NOTE — Telephone Encounter (Signed)
-----   Message from April Dom, MD sent at 11/12/2018 10:16 AM EDT ----- Please let the patient know that her UTI is sensitive to the bactrim she was given and make sure she is feeling better. She does have crystals in her urine which can increase her risk of developing kidney stones. Staying well hydrated can help prevent stone formation.

## 2018-11-19 ENCOUNTER — Ambulatory Visit
Admission: RE | Admit: 2018-11-19 | Discharge: 2018-11-19 | Disposition: A | Discharge status: Home / Self Care | Payer: Commercial Managed Care - PPO | Source: Ambulatory Visit | Attending: Obstetrics and Gynecology | Admitting: Obstetrics and Gynecology

## 2018-11-19 ENCOUNTER — Other Ambulatory Visit: Payer: Self-pay

## 2018-11-19 DIAGNOSIS — Z8739 Personal history of other diseases of the musculoskeletal system and connective tissue: Secondary | ICD-10-CM

## 2018-11-19 DIAGNOSIS — Z1231 Encounter for screening mammogram for malignant neoplasm of breast: Secondary | ICD-10-CM

## 2018-11-19 DIAGNOSIS — E2839 Other primary ovarian failure: Secondary | ICD-10-CM

## 2018-11-19 NOTE — Telephone Encounter (Signed)
Notes recorded by Milano Rosevear, Harley Hallmark, RN on 11/13/2018 at 10:45 AM EDT  Spoke with patient. Results given. Patient verbalizes understanding. Patient states that she is feeling better. Will contact the office if symptoms return.

## 2019-02-07 ENCOUNTER — Encounter

## 2019-02-20 ENCOUNTER — Telehealth: Payer: Self-pay | Admitting: Obstetrics and Gynecology

## 2019-02-20 MED ORDER — SULFAMETHOXAZOLE-TRIMETHOPRIM 800-160 MG PO TABS: 1.0000 | ORAL_TABLET | Freq: Two times a day (BID) | ORAL | 0 refills | Status: DC

## 2019-02-20 MED ORDER — PHENAZOPYRIDINE HCL 200 MG PO TABS: 200.0000 mg | ORAL_TABLET | Freq: Three times a day (TID) | ORAL | 0 refills | Status: DC | PRN

## 2019-02-20 NOTE — Telephone Encounter (Signed)
Spoke with patient. Patient reports dysuria,urinary frequency, urgency, voiding small amounts and "just not feeling well". Symptoms started on 1/12. Patient is out of town. Denies flank pain, fever/chills, N/V, blood in urine. Seen in office on 11/09/18 and tx for UTI. Advised patient to go to local urgent care for further evaluation of symptoms. Recommended patient have copy of results sent to office to update her chart, can schedule f/u with Dr. Oscar La if needed. Patient verbalizes understanding and is agreeable.   Routing to provider for final review. Patient is agreeable to disposition. Will close encounter.

## 2019-02-20 NOTE — Telephone Encounter (Signed)
Spoke with patient, advised as seen below per Dr. Oscar La. Rx to confirmed pharmacy. Patient verbalizes understanding and is agreeable.   Encounter closed.

## 2019-02-20 NOTE — Telephone Encounter (Signed)
Spoke with patient. See previous note dated 02/20/19. Patient states she has called several Urgent Cares in [place], only Virtual visit are available. Patient states most of the urgent cares in that location are now Covid testing sites. Patient is requesting a virtual visit with Dr. Oscar La.   Advised patient in office evaluation is recommended to be able to obtain a urine culture and ensure you are treated with correct abx. Patient states she will not be back in [place] until Friday, states she can not wait that long. She is with her daughter that has just had a baby. Patient is requesting this be reviewed with Dr. Oscar La and return call.   Dr. Oscar La -please review.

## 2019-02-20 NOTE — Telephone Encounter (Signed)
Patient is calling regarding cystitis symptoms. Patient stated that she is having the urgency and pain. Patient is currently out of town. Patient is calling to see if something can be called into the pharmacy or if she needs to go to urgent care in the town that she is currently in.

## 2019-02-20 NOTE — Telephone Encounter (Signed)
Left message to call Virgil Slinger, RN at [facility] [phone].   

## 2019-02-20 NOTE — Telephone Encounter (Signed)
Given her difficulty in being seen and her symptoms. Please call in bactrim DS, 1 po BID x 3 days and pyridium 200 mg, 1 po TID for 2 days to the pharmacy for her. If she isn't feeling better in 2 days or if she develops fever or flank pain she needs to be seen.

## 2019-02-20 NOTE — Telephone Encounter (Signed)
Patient is requesting a virtual visit with [place].

## 2019-08-26 ENCOUNTER — Ambulatory Visit: Payer: Commercial Managed Care - PPO | Admitting: Obstetrics and Gynecology

## 2019-09-02 ENCOUNTER — Ambulatory Visit (INDEPENDENT_AMBULATORY_CARE_PROVIDER_SITE_OTHER): Payer: Commercial Managed Care - PPO | Admitting: Family Medicine

## 2019-09-02 ENCOUNTER — Other Ambulatory Visit: Payer: Self-pay

## 2019-09-02 VITALS — BP 132/82 | Ht 66.0 in | Wt 180.0 lb

## 2019-09-02 DIAGNOSIS — R269 Unspecified abnormalities of gait and mobility: Secondary | ICD-10-CM | POA: Diagnosis not present

## 2019-09-02 DIAGNOSIS — M25571 Pain in right ankle and joints of right foot: Secondary | ICD-10-CM | POA: Diagnosis not present

## 2019-09-03 ENCOUNTER — Encounter: Payer: Self-pay | Admitting: Family Medicine

## 2019-09-03 NOTE — Progress Notes (Signed)
PCP: Clovis Riley, L.August Saucer, MD  Subjective:   HPI: Patient is a 64 y.o. female here for custom orthotics.  Patient returns today for 2 new pairs of custom orthotics. This dates back at least 10 years ago when she recalls she was training for a marathon and was having pain in the right foot more on the medial aspect at the plantar aspect of the first MTP. Custom orthotics helped her significantly get through that. Since that time she has not been running or playing tennis like she used to but she still walks for exercise. When she was younger she sustained an injury to this right lower leg from glass that broke and caused some nerve damage leaving her with difficulties dorsiflexing her right foot completely. Her current issue is similar to that she had over 10 years ago when she had custom orthotics. She does not have any arch or heel pain but is getting some pain on the plantar aspect of her right first MTP area with a callus here. No issues with the left foot.  Past Medical History:  Diagnosis Date  . Blood transfusion without reported diagnosis age 15   lac artery and right sciatic nerve  . Fibrocystic breast 10/98  . Injury of right sciatic nerve 1971   w/laceration of artery and nerve, blood transfusion, fell through plate glass door  . Kidney stone 11/10   stent, lithotripsy  . Vitamin D deficiency     Current Outpatient Medications on File Prior to Visit  Medication Sig Dispense Refill  . ALPRAZolam (XANAX) 0.5 MG tablet Take 0.5 mg by mouth 3 (three) times daily as needed for anxiety.    Marland Kitchen atorvastatin (LIPITOR) 10 MG tablet Take 10 mg by mouth daily.    Marland Kitchen buPROPion (WELLBUTRIN SR) 150 MG 12 hr tablet Take 150 mg by mouth 2 (two) times daily.     . cetirizine (ZYRTEC) 10 MG tablet Take 10 mg by mouth daily.    . Lutein 20 MG CAPS     . Melatonin-Pyridoxine (MELATONEX PO) Take 10 mg by mouth at bedtime.    . montelukast (SINGULAIR) 10 MG tablet TAKE 1 TABLET EVERY DAY AS NEEDED  FOR ALLERGIES OR ASTHMA  12  . phenazopyridine (PYRIDIUM) 200 MG tablet Take 1 tablet (200 mg total) by mouth 3 (three) times daily as needed. 6 tablet 0  . sulfamethoxazole-trimethoprim (BACTRIM DS) 800-160 MG tablet Take 1 tablet by mouth 2 (two) times daily. 6 tablet 0  . Vitamin D, Ergocalciferol, (DRISDOL) 1.25 MG (50000 UT) CAPS capsule 1 CAPSULE TWICE A WEEK ORALLY     No current facility-administered medications on file prior to visit.    Past Surgical History:  Procedure Laterality Date  . BREAST BIOPSY Left   . CESAREAN SECTION     X 3  . LITHOTRIPSY  12/2008  . multiple laceration  age 36   contusion and laceration of iliac artery , abdomen, right leg and elbow  . ORIF FINGER FRACTURE Left 2013 ?    Allergies  Allergen Reactions  . Demerol  [Meperidine Hcl] Nausea And Vomiting  . Meperidine Nausea And Vomiting    Social History   Socioeconomic History  . Marital status: Married    Spouse name: Not on file  . Number of children: 3  . Years of education: Not on file  . Highest education level: Not on file  Occupational History    Employer: UNEMPLOYED  Tobacco Use  . Smoking status: Never Smoker  .  Smokeless tobacco: Never Used  Vaping Use  . Vaping Use: Never used  Substance and Sexual Activity  . Alcohol use: Yes    Alcohol/week: 5.0 standard drinks    Types: 5 Standard drinks or equivalent per week  . Drug use: No  . Sexual activity: Yes    Partners: Male    Birth control/protection: Post-menopausal  Other Topics Concern  . Not on file  Social History Narrative  . Not on file   Social Determinants of Health   Financial Resource Strain:   . Difficulty of Paying Living Expenses:   Food Insecurity:   . Worried About Programme researcher, broadcasting/film/video in the Last Year:   . Barista in the Last Year:   Transportation Needs:   . Freight forwarder (Medical):   Marland Kitchen Lack of Transportation (Non-Medical):   Physical Activity:   . Days of Exercise per Week:    . Minutes of Exercise per Session:   Stress:   . Feeling of Stress :   Social Connections:   . Frequency of Communication with Friends and Family:   . Frequency of Social Gatherings with Friends and Family:   . Attends Religious Services:   . Active Member of Clubs or Organizations:   . Attends Banker Meetings:   Marland Kitchen Marital Status:   Intimate Partner Violence:   . Fear of Current or Ex-Partner:   . Emotionally Abused:   Marland Kitchen Physically Abused:   . Sexually Abused:     Family History  Problem Relation Age of Onset  . Heart disease Mother   . Hypertension Mother   . Pulmonary embolism Mother   . Heart disease Father   . Diabetes Father   . Hypertension Father   . Heart attack Father   . Parkinson's disease Father   . Heart disease Maternal Grandmother   . Heart attack Maternal Grandmother   . Multiple sclerosis Maternal Grandfather   . Diabetes Brother   . Heart disease Brother   . Diabetes Brother   . Multiple births Paternal Aunt        had twins  . Breast cancer Paternal Grandmother   . Hypertension Paternal Grandmother   . Hypertension Paternal Grandfather     BP (!) 132/82   Ht 5\' 6"  (1.676 m)   Wt 180 lb (81.6 kg)   LMP 02/07/2009 (Approximate)   BMI 29.05 kg/m   Review of Systems: See HPI above.     Objective:  Physical Exam:  Gen: NAD, comfortable in exam room  Bilateral foot/ankle: Callus under 1st MT head on right > left.  Small callus under 2nd MT head on right.  Mild transverse arch collapse.  Mortons feet bilaterally with more lateral deviation of 2nd digit on the right.  Relatively well preserved long arch. No other gross deformity, swelling, ecchymoses. No hallux rigidus.  Mild hallux valgus bilaterally. FROM ankles, digits with 5-/5 strength dorsiflexion right ankle. TTP mildly plantar right foot over sesamoids, 1st MTP.  No medial, dorsal, other tenderness of feet or ankles. Negative ant drawer and talar tilt.   Negative  syndesmotic compression. NV intact distally.  Gait:  Lands in mild supination bilaterally.  Rare scuffing right shoe with swing phase related to her slight dorsiflexion weakness. Footwear:  Clear supination pattern with wear lateral shoes at midfoot.   Assessment & Plan:  1. Gait abnormality, right foot pain - 2 pair of custom orthotics fashioned today.  On right forefoot padding with  extra layer under sesamoids/1st MTP area similar to old orthotics that are worn.  These felt very comfortable to her.  Discussed how to break these in over time.  Call us with any concerns otherwise f/u prn. Patient was fitted for a : standard, cushioned, semi-rigid orthotic. The orthotic was heated and afterward the patient stood on the orthotic blank positioned on the orthotic stand. The patient was positioned in subtalar neutral position and 10 degrees of ankle dorsiflexion in a weight bearing stance. After completion of molding, a stable base was applied to the orthotic blank. The blank was ground to a stable position for weight bearing. Size: 9 blue swirl x 2 Base: blue med density eva Posting: right forefoot with extra under sesamoids/1st MTP on right only Additional orthotic padding: none

## 2019-09-26 ENCOUNTER — Ambulatory Visit: Payer: Commercial Managed Care - PPO | Admitting: Obstetrics and Gynecology

## 2019-10-10 ENCOUNTER — Other Ambulatory Visit: Payer: Self-pay | Admitting: Obstetrics and Gynecology

## 2019-10-10 DIAGNOSIS — Z1231 Encounter for screening mammogram for malignant neoplasm of breast: Secondary | ICD-10-CM

## 2019-10-11 ENCOUNTER — Ambulatory Visit (INDEPENDENT_AMBULATORY_CARE_PROVIDER_SITE_OTHER): Payer: Commercial Managed Care - PPO | Admitting: Obstetrics & Gynecology

## 2019-10-11 ENCOUNTER — Telehealth: Payer: Self-pay

## 2019-10-11 ENCOUNTER — Encounter: Payer: Self-pay | Admitting: Obstetrics & Gynecology

## 2019-10-11 ENCOUNTER — Other Ambulatory Visit: Payer: Self-pay

## 2019-10-11 VITALS — BP 122/80 | HR 80 | Resp 14 | Ht 66.0 in | Wt 178.5 lb

## 2019-10-11 DIAGNOSIS — R300 Dysuria: Secondary | ICD-10-CM

## 2019-10-11 MED ORDER — FLUCONAZOLE 150 MG PO TABS: 150.0000 mg | ORAL_TABLET | Freq: Every day | ORAL | 0 refills | Status: DC

## 2019-10-11 MED ORDER — SULFAMETHOXAZOLE-TRIMETHOPRIM 800-160 MG PO TABS: 1.0000 | ORAL_TABLET | Freq: Two times a day (BID) | ORAL | 0 refills | Status: DC

## 2019-10-11 MED ORDER — ESTRADIOL 0.1 MG/GM VA CREA: TOPICAL_CREAM | VAGINAL | 0 refills | Status: DC

## 2019-10-11 NOTE — Telephone Encounter (Signed)
Left message to call Lory Nowaczyk, RN at [facility] [phone].   

## 2019-10-11 NOTE — Progress Notes (Signed)
GYNECOLOGY  VISIT  CC:   dysura  HPI: 64 y.o. G42P3003 Married White or Caucasian female here for burning with urination, urgency and frequency.  Symptoms started this morning.  Patient has taken one dose of AZO this am so urine is very orange.  Denies flank pain.  Denies fever.    GYNECOLOGIC HISTORY: Patient's last menstrual period was 02/07/2009 (approximate). 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   Common wart 08/31/2017   Rotator cuff impingement syndrome of right shoulder 09/01/2016   Neck pain 09/01/2016   Adenopathy 04/27/2015   Congestion of nasal sinus 04/27/2015   Pain in joint, ankle and foot 06/18/2013   Plantar fascia rupture 03/07/2012   Depression 05/02/2011   DEGENERATIVE JOINT DISEASE, CERVICAL SPINE 11/17/2009    Past Medical History:  Diagnosis Date   Blood transfusion without reported diagnosis age 87   lac artery and right sciatic nerve   Fibrocystic breast 10/98   Injury of right sciatic nerve 1971   w/laceration of artery and nerve, blood transfusion, fell through plate glass door   Kidney stone 11/10   stent, lithotripsy   Vitamin D deficiency     Past Surgical History:  Procedure Laterality Date   BREAST BIOPSY Left    CESAREAN SECTION     X 3   LITHOTRIPSY  12/2008   multiple laceration  age 43   contusion and laceration of iliac artery , abdomen, right leg and elbow   ORIF FINGER FRACTURE Left 2013 ?    MEDS:   Current Outpatient Medications on File Prior to Visit  Medication Sig Dispense Refill   ALPRAZolam (XANAX) 0.5 MG tablet Take 0.5 mg by mouth 3 (three) times daily as needed for  anxiety.     atorvastatin (LIPITOR) 10 MG tablet Take 10 mg by mouth daily.     buPROPion (WELLBUTRIN SR) 150 MG 12 hr tablet Take 150 mg by mouth 2 (two) times daily.      cetirizine (ZYRTEC) 10 MG tablet Take 10 mg by mouth daily.     Lutein 20 MG CAPS      Melatonin-Pyridoxine (MELATONEX PO) Take 10 mg by mouth at bedtime.     montelukast (SINGULAIR) 10 MG tablet TAKE 1 TABLET EVERY DAY AS NEEDED FOR ALLERGIES OR ASTHMA  12   Vitamin D, Ergocalciferol, (DRISDOL) 1.25 MG (50000 UT) CAPS capsule 1 CAPSULE TWICE A WEEK ORALLY     No current facility-administered medications on file prior to visit.    ALLERGIES: Demerol  [meperidine hcl] and Meperidine  Family History  Problem Relation Age of Onset   Heart disease Mother    Hypertension Mother    Pulmonary embolism Mother    Heart disease Father    Diabetes Father    Hypertension Father    Heart attack Father    Parkinson's disease Father    Heart disease Maternal Grandmother    Heart attack Maternal Grandmother    Multiple sclerosis Maternal Grandfather    Diabetes Brother    Heart disease Brother    Diabetes Brother    Multiple births Paternal Aunt        had twins   Breast cancer Paternal Grandmother  Hypertension Paternal Grandmother    Hypertension Paternal Grandfather     SH:  Married, non smoker  Review of Systems  Genitourinary: Positive for frequency and urgency.       Burning with urination    All other systems reviewed and are negative.   PHYSICAL EXAMINATION:    BP 122/80 (BP Location: Right Arm, Patient Position: Sitting, Cuff Size: Large)    Pulse 80    Resp 14    Ht 5\' 6"  (1.676 m)    Wt 178 lb 8 oz (81 kg)    LMP 02/07/2009 (Approximate)    BMI 28.81 kg/m     General appearance: alert, cooperative and appears stated age Flank:  No CVA tenderness Abdomen: soft, non-tender; bowel sounds normal; no masses,  no organomegaly Lymph:  no inguinal LAD noted  Pelvic: External  genitalia:  no lesions              Urethra:  normal appearing urethra with no masses, tenderness or lesions              Bartholins and Skenes: normal                 Vagina: normal appearing vagina with normal color and discharge, no lesions              Cervix: no lesions              Bimanual Exam:  Uterus:  normal size, contour, position, consistency, mobility, non-tender              Adnexa: no mass, fullness, tenderness  Chaperone, 04/07/2009, RN, was present for exam.  Assessment: Dysuria c/w UTI H/o nephrolithiasis  Plan: Urine culture pending Rx for bactrim DS BID x 3 days Pt requests rx for diflucan 150mg  po x 1 if needed for vaginal discharge/itching Prevention strategies discussed.  Pt would like to start vaginal estrogen cream, 1 gram estrace pv twice weekly after UTI treated to see if this will decrease frequency

## 2019-10-11 NOTE — Telephone Encounter (Signed)
Patient is calling in regards to having a UTI.  

## 2019-10-11 NOTE — Telephone Encounter (Signed)
Spoke with patient. Patient reports urinary urgency, frequency and dysuria, symptoms started this morning. She has taken one dose of OTC Azo. Requesting OV.   Denies flank pain, fever/chills, N/V, hematuria, vag d/c or odor.   OV scheduled for today at 11:45am with Dr. Hyacinth Meeker, patient aware this is a work in appt.  Patient verbalizes understanding and is agreeable.   Last AEX 08/22/18 w/ Dr. Oscar La Next AEX 01/10/20  Encounter closed.

## 2019-10-13 LAB — URINE CULTURE: Organism ID, Bacteria: NO GROWTH

## 2019-10-15 ENCOUNTER — Telehealth: Payer: Self-pay

## 2019-10-15 NOTE — Telephone Encounter (Signed)
Left message for call back.

## 2019-10-15 NOTE — Telephone Encounter (Signed)
-----   Message from Jerene Bears, MD sent at 10/15/2019 10:53 AM EDT ----- Please let pt know urine culture is negative.  Can you please get update from her about symptoms?  She took azo before she came on Friday.  Thanks.

## 2019-10-17 NOTE — Telephone Encounter (Signed)
Patient states she feels so much better with no problems. She states she did not pick up the cream due to cost but has an appt with Dr Oscar La in December & will follow up with her if she is having any new problems.

## 2019-11-21 ENCOUNTER — Ambulatory Visit: Payer: Commercial Managed Care - PPO

## 2019-12-06 ENCOUNTER — Other Ambulatory Visit: Payer: Self-pay

## 2019-12-06 ENCOUNTER — Ambulatory Visit
Admission: RE | Admit: 2019-12-06 | Discharge: 2019-12-06 | Disposition: A | Discharge status: Home / Self Care | Payer: Commercial Managed Care - PPO | Source: Ambulatory Visit | Attending: Obstetrics and Gynecology | Admitting: Obstetrics and Gynecology

## 2019-12-06 DIAGNOSIS — Z1231 Encounter for screening mammogram for malignant neoplasm of breast: Secondary | ICD-10-CM

## 2020-01-10 ENCOUNTER — Ambulatory Visit (INDEPENDENT_AMBULATORY_CARE_PROVIDER_SITE_OTHER): Payer: Commercial Managed Care - PPO | Admitting: Obstetrics and Gynecology

## 2020-01-10 ENCOUNTER — Encounter: Payer: Self-pay | Admitting: Obstetrics and Gynecology

## 2020-01-10 ENCOUNTER — Other Ambulatory Visit: Payer: Self-pay

## 2020-01-10 VITALS — BP 130/66 | HR 80 | Ht 66.25 in | Wt 179.4 lb

## 2020-01-10 DIAGNOSIS — Z8739 Personal history of other diseases of the musculoskeletal system and connective tissue: Secondary | ICD-10-CM | POA: Diagnosis not present

## 2020-01-10 DIAGNOSIS — Z01419 Encounter for gynecological examination (general) (routine) without abnormal findings: Secondary | ICD-10-CM | POA: Diagnosis not present

## 2020-01-10 NOTE — Progress Notes (Signed)
64 y.o. G83P3003 Married White or Caucasian Not Hispanic or Latino female here for annual exam.  No problems. No vaginal bleeding. No bowel or bladder c/o. No dyspareunia.     Patient's last menstrual period was 02/07/2009 (approximate).          Sexually active: Yes.    The current method of family planning is post menopausal status.    Exercising: Yes.    walking, gardening  Smoker:  no  Health Maintenance: Pap:  06-05-17 negative, HR HPV negative            04-17-14 negative  History of abnormal Pap:  no MMG: 12-06-19 density B/BIRADS 1 negative  BMD:  11-19-2018 stable osteopenia in hip- repeat 2 years. T score -2.0, FRAX 10/1.4% Colonoscopy: 07-2015 polyps, f/u 5 years  TDaP:  05-08-13  Gardasil: no   reports that she has never smoked. She has never used smokeless tobacco. She reports current alcohol use of about 5.0 standard drinks of alcohol per week. She reports that she does not use drugs. Retired. 3 daughters and 4 grand children  Past Medical History:  Diagnosis Date   Blood transfusion without reported diagnosis age 64   lac artery and right sciatic nerve   Fibrocystic breast 10/98   Injury of right sciatic nerve 1971   w/laceration of artery and nerve, blood transfusion, fell through plate glass door   Kidney stone 11/10   stent, lithotripsy   Vitamin D deficiency     Past Surgical History:  Procedure Laterality Date   BREAST BIOPSY Left    CESAREAN SECTION     X 3   LITHOTRIPSY  12/2008   multiple laceration  age 46   contusion and laceration of iliac artery , abdomen, right leg and elbow   ORIF FINGER FRACTURE Left 2013 ?    Current Outpatient Medications  Medication Sig Dispense Refill   ALPRAZolam (XANAX) 0.5 MG tablet Take 0.5 mg by mouth 3 (three) times daily as needed for anxiety.     atorvastatin (LIPITOR) 10 MG tablet Take 10 mg by mouth daily.     buPROPion (WELLBUTRIN SR) 150 MG 12 hr tablet Take 150 mg by mouth 2 (two) times daily.       cetirizine (ZYRTEC) 10 MG tablet Take 10 mg by mouth daily.     Lutein 20 MG CAPS      Melatonin-Pyridoxine (MELATONEX PO) Take 10 mg by mouth at bedtime.     montelukast (SINGULAIR) 10 MG tablet TAKE 1 TABLET EVERY DAY AS NEEDED FOR ALLERGIES OR ASTHMA  12   Vitamin D, Ergocalciferol, (DRISDOL) 1.25 MG (50000 UT) CAPS capsule 1 CAPSULE TWICE A WEEK ORALLY     No current facility-administered medications for this visit.    Family History  Problem Relation Age of Onset   Heart disease Mother    Hypertension Mother    Pulmonary embolism Mother    Heart disease Father    Diabetes Father    Hypertension Father    Heart attack Father    Parkinson's disease Father    Heart disease Maternal Grandmother    Heart attack Maternal Grandmother    Multiple sclerosis Maternal Grandfather    Diabetes Brother    Heart disease Brother    Diabetes Brother    Multiple births Paternal Aunt        had twins   Breast cancer Paternal Grandmother    Hypertension Paternal Grandmother    Hypertension Paternal Actor  Review of Systems  All other systems reviewed and are negative.   Exam:   BP 130/66 (BP Location: Right Arm, Patient Position: Sitting, Cuff Size: Normal)    Pulse 80    Ht 5' 6.25" (1.683 m)    Wt 179 lb 6.4 oz (81.4 kg)    LMP 02/07/2009 (Approximate)    BMI 28.74 kg/m   Weight change: @WEIGHTCHANGE @ Height:   Height: 5' 6.25" (168.3 cm)  Ht Readings from Last 3 Encounters:  01/10/20 5' 6.25" (1.683 m)  10/11/19 5\' 6"  (1.676 m)  09/02/19 5\' 6"  (1.676 m)    General appearance: alert, cooperative and appears stated age Head: Normocephalic, without obvious abnormality, atraumatic Neck: no adenopathy, supple, symmetrical, trachea midline and thyroid normal to inspection and palpation Lungs: clear to auscultation bilaterally Cardiovascular: regular rate and rhythm Breasts: normal appearance, no masses or tenderness Abdomen: soft, non-tender; non  distended,  no masses,  no organomegaly Extremities: extremities normal, atraumatic, no cyanosis or edema Skin: Skin color, texture, turgor normal. No rashes or lesions Lymph nodes: Cervical, supraclavicular, and axillary nodes normal. No abnormal inguinal nodes palpated Neurologic: Grossly normal   Pelvic: External genitalia:  no lesions              Urethra:  normal appearing urethra with no masses, tenderness or lesions              Bartholins and Skenes: normal                 Vagina: atrophic appearing vagina with normal color and discharge, no lesions              Cervix: no lesions               Bimanual Exam:  Uterus:  normal size, contour, position, consistency, mobility, non-tender              Adnexa: no mass, fullness, tenderness               Rectovaginal: Confirms               Anus:  normal sphincter tone, no lesions  Shanon Petty chaperoned for the exam.  A:  Well Woman with normal exam  Stable osteopenia  P:   No pap today  Mammogram UTD  DEXA due in 10/22  Colonoscopy due in 6/22  Discussed breast self exam  Discussed calcium and vit D intake  Labs with primary

## 2020-01-10 NOTE — Patient Instructions (Signed)
EXERCISE   We recommended that you start or continue a regular exercise program for good health. Physical activity is anything that gets your body moving, some is better than none. The CDC recommends 150 minutes per week of Moderate-Intensity Aerobic Activity and 2 or more days of Muscle Strengthening Activity.  Benefits of exercise are limitless: helps weight loss/weight maintenance, improves mood and energy, helps with depression and anxiety, improves sleep, tones and strengthens muscles, improves balance, improves bone density, protects from chronic conditions such as heart disease, high blood pressure and diabetes and so much more. To learn more visit: https://www.cdc.gov/physicalactivity/index.html  DIET: Good nutrition starts with a healthy diet of fruits, vegetables, whole grains, and lean protein sources. Drink plenty of water for hydration. Minimize empty calories, sodium, sweets. For more information about dietary recommendations visit: https://health.gov/our-work/nutrition-physical-activity/dietary-guidelines and https://www.myplate.gov/  ALCOHOL:  Women should limit their alcohol intake to no more than 7 drinks/beers/glasses of wine (combined, not each!) per week. Moderation of alcohol intake to this level decreases your risk of breast cancer and liver damage.  If you are concerned that you may have a problem, or your friends have told you they are concerned about your drinking, there are many resources to help. A well-known program that is free, effective, and available to all people all over the nation is Alcoholics Anonymous.  Check out this site to learn more: https://www.aa.org/   CALCIUM AND VITAMIN D:  Adequate intake of calcium and Vitamin D are recommended for bone health.  The recommendations for exact amounts of these supplements seem to change often, but generally speaking 1000-1500 mg of calcium (between diet and supplement) and 800 units of Vitamin D per day seems prudent.      PAP SMEARS:  Pap smears, to check for cervical cancer or precancers,  have traditionally been done yearly, although recent scientific advances have shown that most women can have pap smears less often.  However, every woman still should have a physical exam from her gynecologist every year. It will include a breast check, inspection of the vulva and vagina to check for abnormal growths or skin changes, a visual exam of the cervix, and then an exam to evaluate the size and shape of the uterus and ovaries.  And after 63 years of age, a rectal exam is indicated to check for rectal cancers. We will also provide age appropriate advice regarding health maintenance, like when you should have certain vaccines, screening for sexually transmitted diseases, bone density testing, colonoscopy, mammograms, etc.   MAMMOGRAMS:  All women over 40 years old should have a routine mammogram.   COLON CANCER SCREENING: Now recommend starting at age 45. At this time colonoscopy is not covered for routine screening until 50. There are take home tests that can be done between 45-49.   COLONOSCOPY:  Colonoscopy to screen for colon cancer is recommended for all women at age 50.  We know, you hate the idea of the prep.  We agree, BUT, having colon cancer and not knowing it is worse!!  Colon cancer so often starts as a polyp that can be seen and removed at colonscopy, which can quite literally save your life!  And if your first colonoscopy is normal and you have no family history of colon cancer, most women don't have to have it again for 10 years.  Once every ten years, you can do something that may end up saving your life, right?  We will be happy to help you get it scheduled when   you are ready.  Be sure to check your insurance coverage so you understand how much it will cost.  It may be covered as a preventative service at no cost, but you should check your particular policy.      Breast Self-Awareness Breast self-awareness  means being familiar with how your breasts look and feel. It involves checking your breasts regularly and reporting any changes to your health care provider. Practicing breast self-awareness is important. A change in your breasts can be a sign of a serious medical problem. Being familiar with how your breasts look and feel allows you to find any problems early, when treatment is more likely to be successful. All women should practice breast self-awareness, including women who have had breast implants. How to do a breast self-exam One way to learn what is normal for your breasts and whether your breasts are changing is to do a breast self-exam. To do a breast self-exam: Look for Changes  1. Remove all the clothing above your waist. 2. Stand in front of a mirror in a room with good lighting. 3. Put your hands on your hips. 4. Push your hands firmly downward. 5. Compare your breasts in the mirror. Look for differences between them (asymmetry), such as: ? Differences in shape. ? Differences in size. ? Puckers, dips, and bumps in one breast and not the other. 6. Look at each breast for changes in your skin, such as: ? Redness. ? Scaly areas. 7. Look for changes in your nipples, such as: ? Discharge. ? Bleeding. ? Dimpling. ? Redness. ? A change in position. Feel for Changes Carefully feel your breasts for lumps and changes. It is best to do this while lying on your back on the floor and again while sitting or standing in the shower or tub with soapy water on your skin. Feel each breast in the following way:  Place the arm on the side of the breast you are examining above your head.  Feel your breast with the other hand.  Start in the nipple area and make  inch (2 cm) overlapping circles to feel your breast. Use the pads of your three middle fingers to do this. Apply light pressure, then medium pressure, then firm pressure. The light pressure will allow you to feel the tissue closest to the  skin. The medium pressure will allow you to feel the tissue that is a little deeper. The firm pressure will allow you to feel the tissue close to the ribs.  Continue the overlapping circles, moving downward over the breast until you feel your ribs below your breast.  Move one finger-width toward the center of the body. Continue to use the  inch (2 cm) overlapping circles to feel your breast as you move slowly up toward your collarbone.  Continue the up and down exam using all three pressures until you reach your armpit.  Write Down What You Find  Write down what is normal for each breast and any changes that you find. Keep a written record with breast changes or normal findings for each breast. By writing this information down, you do not need to depend only on memory for size, tenderness, or location. Write down where you are in your menstrual cycle, if you are still menstruating. If you are having trouble noticing differences in your breasts, do not get discouraged. With time you will become more familiar with the variations in your breasts and more comfortable with the exam. How often should I examine   my breasts? Examine your breasts every month. If you are breastfeeding, the best time to examine your breasts is after a feeding or after using a breast pump. If you menstruate, the best time to examine your breasts is 5-7 days after your period is over. During your period, your breasts are lumpier, and it may be more difficult to notice changes. When should I see my health care provider? See your health care provider if you notice:  A change in shape or size of your breasts or nipples.  A change in the skin of your breast or nipples, such as a reddened or scaly area.  Unusual discharge from your nipples.  A lump or thick area that was not there before.  Pain in your breasts.  Anything that concerns you.  

## 2020-03-02 ENCOUNTER — Other Ambulatory Visit: Payer: Self-pay | Admitting: Obstetrics and Gynecology

## 2020-03-02 DIAGNOSIS — Z1231 Encounter for screening mammogram for malignant neoplasm of breast: Secondary | ICD-10-CM

## 2020-12-08 ENCOUNTER — Ambulatory Visit
Admission: RE | Admit: 2020-12-08 | Discharge: 2020-12-08 | Disposition: A | Discharge status: Home / Self Care | Payer: Commercial Managed Care - PPO | Source: Ambulatory Visit | Attending: Obstetrics and Gynecology | Admitting: Obstetrics and Gynecology

## 2020-12-08 ENCOUNTER — Other Ambulatory Visit: Payer: Self-pay

## 2020-12-08 DIAGNOSIS — Z1231 Encounter for screening mammogram for malignant neoplasm of breast: Secondary | ICD-10-CM

## 2020-12-08 DIAGNOSIS — Z8739 Personal history of other diseases of the musculoskeletal system and connective tissue: Secondary | ICD-10-CM

## 2021-01-11 NOTE — Progress Notes (Signed)
65 y.o. G57P3003 Married White or Caucasian Not Hispanic or Latino female here for annual exam.  No vaginal bleeding. No dyspareunia.  No bowel or bladder issues.     Patient's last menstrual period was 02/07/2009 (approximate).          Sexually active: Yes.    The current method of family planning is post menopausal status.    Exercising: No.   Smoker:  no  Health Maintenance: Pap:   06-05-17 negative, HR HPV negative            04-17-14 negative  History of abnormal Pap:  no MMG:  12/09/20 Bi-rads 1 neg  BMD:   12/09/20  Osteopenic, T score -2.1, FRAX 17.7/2.8% Colonoscopy: 06/10/20 polyps, f/u in 3 years.  TDaP:  05/08/13  Gardasil: n/a   reports that she has never smoked. She has never used smokeless tobacco. She reports current alcohol use of about 5.0 standard drinks per week. She reports that she does not use drugs. 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  3 daughters and 4 grand children.  Past Medical History:  Diagnosis Date   Blood transfusion without reported diagnosis age 65   lac artery and right sciatic nerve   Fibrocystic breast 10/98   Injury of right sciatic nerve 1971   w/laceration of artery and nerve, blood transfusion, fell through plate glass door   Kidney stone 11/10   stent, lithotripsy   Vitamin D deficiency     Past Surgical History:  Procedure Laterality Date   BREAST BIOPSY Left    CESAREAN SECTION     X 3   LITHOTRIPSY  12/2008   multiple laceration  age 59   contusion and laceration of iliac artery , abdomen, right leg and elbow   ORIF FINGER FRACTURE Left 2013 ?    Current Outpatient Medications  Medication Sig Dispense Refill   atorvastatin (LIPITOR) 10 MG tablet Take 10 mg by mouth daily.     buPROPion (WELLBUTRIN SR) 150 MG 12 hr tablet Take 150 mg by mouth 2 (two) times daily.      cetirizine (ZYRTEC) 10 MG tablet Take 10 mg by mouth daily.     Lutein 20 MG CAPS      Melatonin-Pyridoxine  (MELATONEX PO) Take 10 mg by mouth at bedtime.     montelukast (SINGULAIR) 10 MG tablet TAKE 1 TABLET EVERY DAY AS NEEDED FOR ALLERGIES OR ASTHMA  12   Vitamin D, Ergocalciferol, (DRISDOL) 1.25 MG (50000 UT) CAPS capsule 1 CAPSULE TWICE A WEEK ORALLY     No current facility-administered medications for this visit.    Family History  Problem Relation Age of Onset   Heart disease Mother    Hypertension Mother    Pulmonary embolism Mother    Heart disease Father    Diabetes Father    Hypertension Father    Heart attack Father    Parkinson's disease Father    Heart disease Maternal Grandmother    Heart attack Maternal Grandmother    Multiple sclerosis Maternal Grandfather    Diabetes Brother    Heart disease Brother    Diabetes Brother    Multiple births Paternal Aunt        had twins   Breast cancer Paternal Grandmother    Hypertension Paternal Grandmother    Hypertension Paternal Grandfather     Review of Systems  All other systems reviewed and are negative.  Exam:   BP  118/72 (BP Location: Right Arm)   Pulse 88   Resp 20   Ht 5' 5.85" (1.673 m)   Wt 185 lb 9.6 oz (84.2 kg)   LMP 02/07/2009 (Approximate)   SpO2 97%   BMI 30.10 kg/m   Weight change: @WEIGHTCHANGE @ Height:   Height: 5' 5.85" (167.3 cm)  Ht Readings from Last 3 Encounters:  01/14/21 5' 5.85" (1.673 m)  01/10/20 5' 6.25" (1.683 m)  10/11/19 5\' 6"  (1.676 m)    General appearance: alert, cooperative and appears stated age Head: Normocephalic, without obvious abnormality, atraumatic Neck: no adenopathy, supple, symmetrical, trachea midline and thyroid normal to inspection and palpation Lungs: clear to auscultation bilaterally Cardiovascular: regular rate and rhythm Breasts: normal appearance, no masses or tenderness Abdomen: soft, non-tender; non distended,  no masses,  no organomegaly Extremities: extremities normal, atraumatic, no cyanosis or edema Skin: Skin color, texture, turgor normal. No rashes  or lesions Lymph nodes: Cervical, supraclavicular, and axillary nodes normal. No abnormal inguinal nodes palpated Neurologic: Grossly normal   Pelvic: External genitalia:  no lesions              Urethra:  normal appearing urethra with no masses, tenderness or lesions              Bartholins and Skenes: normal                 Vagina: normal appearing vagina with normal color and discharge, no lesions              Cervix: no lesions               Bimanual Exam:  Uterus:   no masses or tenderness              Adnexa: no mass, fullness, tenderness               Rectovaginal: Confirms               Anus:  normal sphincter tone, no lesions  12/11/19 chaperoned for the exam.  1. Well woman exam Mammogram and colonoscopy UTD Discussed breast self exam Discussed breast self exam Discussed calcium and vit D intake Labs with primary  2. History of osteopenia Discussed calcium and vit D intake Discussed balance and decreasing fall risk DEXA UTD

## 2021-01-14 ENCOUNTER — Encounter: Payer: Self-pay | Admitting: Obstetrics and Gynecology

## 2021-01-14 ENCOUNTER — Other Ambulatory Visit: Payer: Self-pay

## 2021-01-14 ENCOUNTER — Ambulatory Visit (INDEPENDENT_AMBULATORY_CARE_PROVIDER_SITE_OTHER): Payer: Commercial Managed Care - PPO | Admitting: Obstetrics and Gynecology

## 2021-01-14 VITALS — BP 118/72 | HR 88 | Resp 20 | Ht 65.85 in | Wt 185.6 lb

## 2021-01-14 DIAGNOSIS — Z8739 Personal history of other diseases of the musculoskeletal system and connective tissue: Secondary | ICD-10-CM

## 2021-01-14 DIAGNOSIS — Z01419 Encounter for gynecological examination (general) (routine) without abnormal findings: Secondary | ICD-10-CM

## 2021-01-14 NOTE — Patient Instructions (Signed)

## 2021-03-26 ENCOUNTER — Telehealth: Payer: Self-pay | Admitting: *Deleted

## 2021-03-26 NOTE — Telephone Encounter (Signed)
Patient called in route back to [facility] via plane for a trip. Called c/o UTI symptoms asked if Rx could be sent to pharmacy. I advised patient our provider protocol is to be seen for office visit for UTI'S. Patient will be back in town around 6pm, advised to stop by Urgent care for treatment. Patient verbalized she would stop there.

## 2021-06-03 ENCOUNTER — Ambulatory Visit: Payer: Commercial Managed Care - PPO | Admitting: Obstetrics and Gynecology

## 2022-01-10 NOTE — Progress Notes (Deleted)
66 y.o. G21P3003 Married White or Caucasian Not Hispanic or Latino female here for annual exam.      Patient's last menstrual period was 02/07/2009 (approximate).          Sexually active: {yes no:314532}  The current method of family planning is {contraception:315051}.    Exercising: {yes no:314532}  {types:19826} Smoker:  {YES [id number]  Health Maintenance: Pap: 06-05-17 negative, HR HPV negative            04-17-14 negative  History of abnormal Pap:  no MMG:  12/09/20 density B Bi-rads 1 neg  BMD:   12/08/20 osteopenic/ low bone mass  Colonoscopy: 06/10/20 polyps, f/u in 3 years.  TDaP:  05/08/13  Gardasil: n/a  reports that she has never smoked. She has never used smokeless tobacco. She reports current alcohol use of about 5.0 standard drinks of alcohol per week. She reports that she does not use drugs.  Past Medical History:  Diagnosis Date   Blood transfusion without reported diagnosis age 10   lac artery and right sciatic nerve   Fibrocystic breast 10/98   Injury of right sciatic nerve 1971   w/laceration of artery and nerve, blood transfusion, fell through plate glass door   Kidney stone 11/10   stent, lithotripsy   Vitamin D deficiency     Past Surgical History:  Procedure Laterality Date   BREAST BIOPSY Left    CESAREAN SECTION     X 3   LITHOTRIPSY  12/2008   multiple laceration  age 41   contusion and laceration of iliac artery , abdomen, right leg and elbow   ORIF FINGER FRACTURE Left 2013 ?    Current Outpatient Medications  Medication Sig Dispense Refill   atorvastatin (LIPITOR) 10 MG tablet Take 10 mg by mouth daily.     buPROPion (WELLBUTRIN SR) 150 MG 12 hr tablet Take 150 mg by mouth 2 (two) times daily.      cetirizine (ZYRTEC) 10 MG tablet Take 10 mg by mouth daily.     Lutein 20 MG CAPS      Melatonin-Pyridoxine (MELATONEX PO) Take 10 mg by mouth at bedtime.     montelukast (SINGULAIR) 10 MG tablet TAKE 1 TABLET EVERY DAY AS NEEDED FOR ALLERGIES OR  ASTHMA  12   Vitamin D, Ergocalciferol, (DRISDOL) 1.25 MG (50000 UT) CAPS capsule 1 CAPSULE TWICE A WEEK ORALLY     No current facility-administered medications for this visit.    Family History  Problem Relation Age of Onset   Heart disease Mother    Hypertension Mother    Pulmonary embolism Mother    Heart disease Father    Diabetes Father    Hypertension Father    Heart attack Father    Parkinson's disease Father    Heart disease Maternal Grandmother    Heart attack Maternal Grandmother    Multiple sclerosis Maternal Grandfather    Diabetes Brother    Heart disease Brother    Diabetes Brother    Multiple births Paternal Aunt        had twins   Breast cancer Paternal Grandmother    Hypertension Paternal Grandmother    Hypertension Paternal Grandfather     Review of Systems  Exam:   LMP 02/07/2009 (Approximate)   Weight change: @WEIGHTCHANGE @ Height:      Ht Readings from Last 3 Encounters:  01/14/21 5' 5.85" (1.673 m)  01/10/20 5' 6.25" (1.683 m)  10/11/19 5\' 6"  (1.676 m)    General appearance:  alert, cooperative and appears stated age Head: Normocephalic, without obvious abnormality, atraumatic Neck: no adenopathy, supple, symmetrical, trachea midline and thyroid {CHL AMB PHY EX THYROID NORM [phone]::"normal to inspection and palpation"} Lungs: clear to auscultation bilaterally Cardiovascular: regular rate and rhythm Breasts: {Exam; breast:13139::"normal appearance, no masses or tenderness"} Abdomen: soft, non-tender; non distended,  no masses,  no organomegaly Extremities: extremities normal, atraumatic, no cyanosis or edema Skin: Skin color, texture, turgor normal. No rashes or lesions Lymph nodes: Cervical, supraclavicular, and axillary nodes normal. No abnormal inguinal nodes palpated Neurologic: Grossly normal   Pelvic: External genitalia:  no lesions              Urethra:  normal appearing urethra with no masses, tenderness or lesions               Bartholins and Skenes: normal                 Vagina: normal appearing vagina with normal color and discharge, no lesions              Cervix: {CHL AMB PHY EX CERVIX NORM DEFAULT:[phone]::"no lesions"}               Bimanual Exam:  Uterus:  {CHL AMB PHY EX UTERUS NORM DEFAULT:[phone]::"normal size, contour, position, consistency, mobility, non-tender"}              Adnexa: {CHL AMB PHY EX ADNEXA NO MASS [phone]::"no mass, fullness, tenderness"}               Rectovaginal: Confirms               Anus:  normal sphincter tone, no lesions  *** chaperoned for the exam.  A:  Well Woman with normal exam  P:

## 2022-01-19 ENCOUNTER — Ambulatory Visit: Payer: Commercial Managed Care - PPO | Admitting: Obstetrics and Gynecology

## 2022-03-31 DIAGNOSIS — E669 Obesity, unspecified: Secondary | ICD-10-CM | POA: Diagnosis not present

## 2022-03-31 DIAGNOSIS — J0181 Other acute recurrent sinusitis: Secondary | ICD-10-CM | POA: Diagnosis not present

## 2022-03-31 DIAGNOSIS — Z6830 Body mass index (BMI) 30.0-30.9, adult: Secondary | ICD-10-CM | POA: Diagnosis not present

## 2022-03-31 DIAGNOSIS — R0981 Nasal congestion: Secondary | ICD-10-CM | POA: Diagnosis not present

## 2022-03-31 DIAGNOSIS — R519 Headache, unspecified: Secondary | ICD-10-CM | POA: Diagnosis not present

## 2022-03-31 DIAGNOSIS — J309 Allergic rhinitis, unspecified: Secondary | ICD-10-CM | POA: Diagnosis not present

## 2022-04-14 DIAGNOSIS — J101 Influenza due to other identified influenza virus with other respiratory manifestations: Secondary | ICD-10-CM | POA: Diagnosis not present

## 2022-04-14 DIAGNOSIS — Z20822 Contact with and (suspected) exposure to covid-19: Secondary | ICD-10-CM | POA: Diagnosis not present

## 2022-04-17 DIAGNOSIS — L500 Allergic urticaria: Secondary | ICD-10-CM | POA: Diagnosis not present

## 2022-05-06 ENCOUNTER — Other Ambulatory Visit: Payer: Self-pay | Admitting: Obstetrics and Gynecology

## 2022-05-06 ENCOUNTER — Ambulatory Visit
Admission: RE | Admit: 2022-05-06 | Discharge: 2022-05-06 | Disposition: A | Discharge status: Home / Self Care | Payer: Medicare HMO | Source: Ambulatory Visit | Attending: Obstetrics and Gynecology | Admitting: Obstetrics and Gynecology

## 2022-05-06 DIAGNOSIS — Z1231 Encounter for screening mammogram for malignant neoplasm of breast: Secondary | ICD-10-CM

## 2022-05-16 DIAGNOSIS — Z6829 Body mass index (BMI) 29.0-29.9, adult: Secondary | ICD-10-CM | POA: Diagnosis not present

## 2022-05-16 DIAGNOSIS — Z01419 Encounter for gynecological examination (general) (routine) without abnormal findings: Secondary | ICD-10-CM | POA: Diagnosis not present

## 2022-05-23 DIAGNOSIS — J309 Allergic rhinitis, unspecified: Secondary | ICD-10-CM | POA: Diagnosis not present

## 2022-05-23 DIAGNOSIS — J0141 Acute recurrent pansinusitis: Secondary | ICD-10-CM | POA: Diagnosis not present

## 2022-05-23 DIAGNOSIS — J3489 Other specified disorders of nose and nasal sinuses: Secondary | ICD-10-CM | POA: Diagnosis not present

## 2022-05-23 DIAGNOSIS — J342 Deviated nasal septum: Secondary | ICD-10-CM | POA: Diagnosis not present

## 2022-05-23 DIAGNOSIS — J329 Chronic sinusitis, unspecified: Secondary | ICD-10-CM | POA: Diagnosis not present

## 2022-05-30 ENCOUNTER — Ambulatory Visit: Payer: Medicare HMO | Attending: Obstetrics and Gynecology | Admitting: Physical Therapy

## 2022-05-30 ENCOUNTER — Other Ambulatory Visit: Payer: Self-pay

## 2022-05-30 DIAGNOSIS — R293 Abnormal posture: Secondary | ICD-10-CM

## 2022-05-30 DIAGNOSIS — R279 Unspecified lack of coordination: Secondary | ICD-10-CM | POA: Insufficient documentation

## 2022-05-30 DIAGNOSIS — M6281 Muscle weakness (generalized): Secondary | ICD-10-CM

## 2022-05-30 DIAGNOSIS — R269 Unspecified abnormalities of gait and mobility: Secondary | ICD-10-CM | POA: Diagnosis not present

## 2022-05-30 NOTE — Patient Instructions (Signed)

## 2022-05-30 NOTE — Therapy (Signed)
OUTPATIENT PHYSICAL THERAPY FEMALE PELVIC EVALUATION   Patient Name: April Silva MRN: [id number] DOB:[birth year], 67 y.o., female Today's Date: 05/30/2022  END OF SESSION:  PT End of Session - 05/30/22 1236     Visit Number 1    Date for PT Re-Evaluation 08/29/22    Authorization Type [employer]    Authorization - Visit Number 10    PT Start Time 1230    PT Stop Time 1310    PT Time Calculation (min) 40 min    Activity Tolerance Patient tolerated treatment well    Behavior During Therapy [facility] for tasks assessed/performed             Past Medical History:  Diagnosis Date   Blood transfusion without reported diagnosis age 79   lac artery and right sciatic nerve   Fibrocystic breast 10/98   Injury of right sciatic nerve 1971   w/laceration of artery and nerve, blood transfusion, fell through plate glass door   Kidney stone 11/10   stent, lithotripsy   Vitamin D deficiency    Past Surgical History:  Procedure Laterality Date   BREAST BIOPSY Left    CESAREAN SECTION     X 3   LITHOTRIPSY  12/2008   multiple laceration  age 89   contusion and laceration of iliac artery , abdomen, right leg and elbow   ORIF FINGER FRACTURE Left 2013 ?   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/31/2017   Anxiety 08/31/2017   Arthritis 08/31/2017   Avitaminosis D 08/31/2017   Cannot sleep 08/31/2017   Combined fat and carbohydrate induced hyperlipemia 08/31/2017   Common wart 08/31/2017   Rotator cuff impingement syndrome of right shoulder 09/01/2016   Neck pain 09/01/2016   Adenopathy 04/27/2015   Pain in joint, ankle and foot 06/18/2013   Plantar fascia rupture 03/07/2012   Depression 05/02/2011   DEGENERATIVE JOINT DISEASE, CERVICAL SPINE 11/17/2009    PCP: Clovis Riley, L.August Saucer, MD  REFERRING PROVIDER: Zelphia Cairo, MD  REFERRING DIAG: N39.3 (ICD-10-CM) - Stress incontinence (female)  (female)  THERAPY DIAG:  Muscle weakness (generalized)  Abnormality of gait and mobility  Abnormal posture  Unspecified lack of coordination  Rationale for Evaluation and Treatment: Rehabilitation  ONSET DATE: year and half ago  SUBJECTIVE:                                                                                                                                                                                           SUBJECTIVE STATEMENT: For about a year and half, pt has had urinary  leakage with coughing since having several sinus infections. Also has sneezing.    PAIN:  Are you having pain? No   PRECAUTIONS: None  WEIGHT BEARING RESTRICTIONS: No  FALLS:  Has patient fallen in last 6 months? 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 [employer]  PLOF: Independent  PATIENT GOALS: to have less leakage  PERTINENT HISTORY:  3x-c-sections Sexual abuse: No  BOWEL MOVEMENT: Pain with bowel movement: No Type of bowel movement:Type (Bristol Stool Scale) 4, Frequency daily, and Strain No Fully empty rectum: Yes:   Leakage: No Pads: No Fiber supplement: No  URINATION: Pain with urination: No Fully empty bladder: Yes: feels like I do but then feel like  Stream: Strong Urgency: Yes:   Frequency: sometimes feels like I need to go back a few times after emptying Leakage: Coughing and Sneezing Pads: No - was wearing pads with sinus infections - usually has coughing for a couple months after it clears and has more leakage.  INTERCOURSE: Pain with intercourse:  not painful Ability to have vaginal penetration:  Yes:   Climax: not painful  Marinoff Scale: 0/3  PREGNANCY: Vaginal deliveries 0 Tearing No C-section deliveries 3 Currently pregnant No  PROLAPSE: None   OBJECTIVE:   DIAGNOSTIC FINDINGS:    COGNITION: Overall cognitive status: Within functional limits for  tasks assessed     SENSATION: Light touch: Appears intact Proprioception: Appears intact  MUSCLE LENGTH: Bil hamstrings and adductors limited by 25%    POSTURE: rounded shoulders, forward head, and posterior pelvic tilt  PELVIC ALIGNMENT: WFL  LUMBARAROM/PROM:  A/PROM A/PROM  eval  Flexion WFL  Extension WFL  Right lateral flexion WFL  Left lateral flexion WFL  Right rotation WFL  Left rotation WFL   (Blank rows = not tested)  LOWER EXTREMITY ROM:  WFL  LOWER EXTREMITY MMT: Bil hips grossly 4/5, knees 5/5   PALPATION:   General  WFL                External Perineal Exam no TTP                             Internal Pelvic Floor WFL, no pain  Patient confirms identification and approves PT to assess internal pelvic floor and treatment Yes  PELVIC MMT:   MMT eval  Vaginal 4/5, 7s, 6 reps  Internal Anal Sphincter   External Anal Sphincter   Puborectalis   Diastasis Recti   (Blank rows = not tested)        TONE: WFL  PROLAPSE: Not seen in hooklying   TODAY'S TREATMENT:                                                                                                                              DATE:   05/30/2022 EVAL Examination completed, findings reviewed,  pt educated on POC, HEP, and urge drill. Pt motivated to participate in PT and agreeable to attempt recommendations.     PATIENT EDUCATION:  Education details: [email or web address] Person educated: Patient Education method: Solicitor, Actor cues, Verbal cues, and Handouts Education comprehension: verbalized understanding, returned demonstration, verbal cues required, and needs further education  HOME EXERCISE PROGRAM: [email or web address]  ASSESSMENT:  CLINICAL IMPRESSION: Patient is a 67 y.o. female  who was seen today for physical therapy evaluation and treatment for stress incontinence. Pt reports after a series of several sinus infections and increased coughing she has had leakage for the last  year and half. Pt states she may have a little with sneezing sometimes but not consistently. Coughing is her biggest concern. Pt also endorses urgency/increased frequency without leakage as well. Pt found to have mildly decreased flexibility at hips and decreased core and hip strength. Patient consented to internal pelvic floor assessment vaginally this date and found to have decreased strength, endurance, and coordination. Pt would benefit from additional PT to further address deficits.    OBJECTIVE IMPAIRMENTS: decreased coordination, decreased endurance, decreased strength, impaired flexibility, improper body mechanics, and postural dysfunction.   ACTIVITY LIMITATIONS: continence  PARTICIPATION LIMITATIONS: community activity  PERSONAL FACTORS: 1 comorbidity: x3 pregnancies and c-sections  are also affecting patient's functional outcome.   REHAB POTENTIAL: Good  CLINICAL DECISION MAKING: Stable/uncomplicated  EVALUATION COMPLEXITY: Low   GOALS: Goals reviewed with patient? Yes  SHORT TERM GOALS: Target date: 06/27/22  Pt to be I with HEP.  Baseline: Goal status: INITIAL  LONG TERM GOALS: Target date: 08/29/22  Pt to be I with advanced HEP.  Baseline:  Goal status: INITIAL  2.  Pt will have 25% less urgency due to bladder retraining and strengthening  Baseline:  Goal status: INITIAL  3.  Pt to report improved time between bladder voids to at least 2 hours for improved QOL with decreased urinary frequency.   Baseline:  Goal status: INITIAL  4.  Pt to demonstrate improved coordination of pelvic floor and breathing mechanics with appropriate synergistic patterns to decrease pain and leakage at least 75% of the time.    Baseline:  Goal status: INITIAL  5.  Pt to report at least a 75% reduction in leakage symptoms for improved QOL.  Baseline:  Goal status: INITIAL  6. Pt to demonstrate at least 4/5 pelvic floor strength and holds for at least 8s for improved pelvic  stability and decreased strain at pelvic floor/ decrease leakage.  PLAN:  PT FREQUENCY: every other week  PT DURATION:  6 sessions  PLANNED INTERVENTIONS: Therapeutic exercises, Therapeutic activity, Neuromuscular re-education, Balance training, Gait training, Patient/Family education, Self Care, Cryotherapy, Moist heat, scar mobilization, Taping, Biofeedback, and Manual therapy  PLAN FOR NEXT SESSION: coordination of pelvic floor and breathing with functional strengthening, review urge drill if needed, bladder irritants, pelvic floor strengthening   Otelia Sergeant, PT, DPT 04/22/241:58 PM

## 2022-06-09 DIAGNOSIS — R87611 Atypical squamous cells cannot exclude high grade squamous intraepithelial lesion on cytologic smear of cervix (ASC-H): Secondary | ICD-10-CM | POA: Diagnosis not present

## 2022-06-15 ENCOUNTER — Ambulatory Visit: Payer: Medicare HMO | Attending: Obstetrics and Gynecology | Admitting: Physical Therapy

## 2022-06-15 DIAGNOSIS — R279 Unspecified lack of coordination: Secondary | ICD-10-CM | POA: Diagnosis not present

## 2022-06-15 DIAGNOSIS — M6281 Muscle weakness (generalized): Secondary | ICD-10-CM

## 2022-06-15 DIAGNOSIS — R293 Abnormal posture: Secondary | ICD-10-CM | POA: Diagnosis not present

## 2022-06-15 NOTE — Therapy (Signed)
OUTPATIENT PHYSICAL THERAPY FEMALE PELVIC TREATMENT   Patient Name: April Silva MRN: [id number] DOB:1955/07/15, 67 y.o., female Today's Date: 06/15/2022  END OF SESSION:  PT End of Session - 06/15/22 0847     Visit Number 2    Date for PT Re-Evaluation 08/29/22    Authorization Type [employer]    Authorization - Visit Number 10    PT Start Time [phone]    PT Stop Time 0927    PT Time Calculation (min) 41 min    Activity Tolerance Patient tolerated treatment well    Behavior During Therapy [facility] for tasks assessed/performed             Past Medical History:  Diagnosis Date   Blood transfusion without reported diagnosis age 4   lac artery and right sciatic nerve   Fibrocystic breast 10/98   Injury of right sciatic nerve 1971   w/laceration of artery and nerve, blood transfusion, fell through plate glass door   Kidney stone 11/10   stent, lithotripsy   Vitamin D deficiency    Past Surgical History:  Procedure Laterality Date   BREAST BIOPSY Left    CESAREAN SECTION     X 3   LITHOTRIPSY  12/2008   multiple laceration  age 29   contusion and laceration of iliac artery , abdomen, right leg and elbow   ORIF FINGER FRACTURE Left 2013 ?   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/31/2017   Anxiety 08/31/2017   Arthritis 08/31/2017   Avitaminosis D 08/31/2017   Cannot sleep 08/31/2017   Combined fat and carbohydrate induced hyperlipemia 08/31/2017   Common wart 08/31/2017   Rotator cuff impingement syndrome of right shoulder 09/01/2016   Neck pain 09/01/2016   Adenopathy 04/27/2015   Pain in joint, ankle and foot 06/18/2013   Plantar fascia rupture 03/07/2012   Depression 05/02/2011   DEGENERATIVE JOINT DISEASE, CERVICAL SPINE 11/17/2009    PCP: Clovis Riley, L.August Saucer, MD  REFERRING PROVIDER: Zelphia Cairo, MD  REFERRING DIAG: N39.3 (ICD-10-CM) - Stress incontinence (female)  (female)  THERAPY DIAG:  Muscle weakness (generalized)  Abnormal posture  Unspecified lack of coordination  Rationale for Evaluation and Treatment: Rehabilitation  ONSET DATE: year and half ago  SUBJECTIVE:                                                                                                                                                                                           SUBJECTIVE STATEMENT: Pt reports she has been doing HEP and reports she hasn't had a lot of coughing  so leakage hasn't been bad.also has been doing little coughs with kegels and doesn't have leakage with this but isn't able to catch a cough naturally yet. Was able to wait between voids without leakage.    PAIN:  Are you having pain? No   PRECAUTIONS: None  WEIGHT BEARING RESTRICTIONS: No  FALLS:  Has patient fallen in last 6 months? 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 [employer]  PLOF: Independent  PATIENT GOALS: to have less leakage  PERTINENT HISTORY:  3x-c-sections Sexual abuse: No  BOWEL MOVEMENT: Pain with bowel movement: No Type of bowel movement:Type (Bristol Stool Scale) 4, Frequency daily, and Strain No Fully empty rectum: Yes:   Leakage: No Pads: No Fiber supplement: No  URINATION: Pain with urination: No Fully empty bladder: Yes: feels like I do but then feel like  Stream: Strong Urgency: Yes:   Frequency: sometimes feels like I need to go back a few times after emptying Leakage: Coughing and Sneezing Pads: No - was wearing pads with sinus infections - usually has coughing for a couple months after it clears and has more leakage.  INTERCOURSE: Pain with intercourse:  not painful Ability to have vaginal penetration:  Yes:   Climax: not painful  Marinoff Scale: 0/3  PREGNANCY: Vaginal deliveries 0 Tearing No C-section deliveries 3 Currently pregnant  No  PROLAPSE: None   OBJECTIVE:   DIAGNOSTIC FINDINGS:    COGNITION: Overall cognitive status: Within functional limits for tasks assessed     SENSATION: Light touch: Appears intact Proprioception: Appears intact  MUSCLE LENGTH: Bil hamstrings and adductors limited by 25%    POSTURE: rounded shoulders, forward head, and posterior pelvic tilt  PELVIC ALIGNMENT: WFL  LUMBARAROM/PROM:  A/PROM A/PROM  eval  Flexion WFL  Extension WFL  Right lateral flexion WFL  Left lateral flexion WFL  Right rotation WFL  Left rotation WFL   (Blank rows = not tested)  LOWER EXTREMITY ROM:  WFL  LOWER EXTREMITY MMT: Bil hips grossly 4/5, knees 5/5   PALPATION:   General  WFL                External Perineal Exam no TTP                             Internal Pelvic Floor WFL, no pain  Patient confirms identification and approves PT to assess internal pelvic floor and treatment Yes  PELVIC MMT:   MMT eval  Vaginal 4/5, 7s, 6 reps  Internal Anal Sphincter   External Anal Sphincter   Puborectalis   Diastasis Recti   (Blank rows = not tested)        TONE: WFL  PROLAPSE: Not seen in hooklying   TODAY'S TREATMENT:  DATE:   05/30/2022 EVAL Examination completed, findings reviewed, pt educated on POC, HEP, and urge drill. Pt motivated to participate in PT and agreeable to attempt recommendations.    06/15/22  NMRE: Opp hand/knee ball pres 2x10 Bridges 2x10 Bird dogs 2x10 Seated OHP 3# bil 2x10  Standing bicep curls 2x10 3# Quick skis x10 each Sit to stand 15# from mat table 2x10 Dead lifts 15# 2x10 2x10 15# squats  PATIENT EDUCATION:  Education details: [email or web address] Person educated: Patient Education method: Explanation, Demonstration, Tactile cues, Verbal cues, and Handouts Education comprehension: verbalized understanding, returned  demonstration, verbal cues required, and needs further education  HOME EXERCISE PROGRAM: [email or web address]  ASSESSMENT:  CLINICAL IMPRESSION: Patient presents for treatment, focus on. Hip and core coordination with pelvic floor to decreased stress for leakage with activity. Pt tolerated well denied leakage throughout. Pt would benefit from additional PT to further address deficits.    OBJECTIVE IMPAIRMENTS: decreased coordination, decreased endurance, decreased strength, impaired flexibility, improper body mechanics, and postural dysfunction.   ACTIVITY LIMITATIONS: continence  PARTICIPATION LIMITATIONS: community activity  PERSONAL FACTORS: 1 comorbidity: x3 pregnancies and c-sections  are also affecting patient's functional outcome.   REHAB POTENTIAL: Good  CLINICAL DECISION MAKING: Stable/uncomplicated  EVALUATION COMPLEXITY: Low   GOALS: Goals reviewed with patient? Yes  SHORT TERM GOALS: Target date: 06/27/22  Pt to be I with HEP.  Baseline: Goal status: INITIAL  LONG TERM GOALS: Target date: 08/29/22  Pt to be I with advanced HEP.  Baseline:  Goal status: INITIAL  2.  Pt will have 25% less urgency due to bladder retraining and strengthening  Baseline:  Goal status: INITIAL  3.  Pt to report improved time between bladder voids to at least 2 hours for improved QOL with decreased urinary frequency.   Baseline:  Goal status: INITIAL  4.  Pt to demonstrate improved coordination of pelvic floor and breathing mechanics with appropriate synergistic patterns to decrease pain and leakage at least 75% of the time.    Baseline:  Goal status: INITIAL  5.  Pt to report at least a 75% reduction in leakage symptoms for improved QOL.  Baseline:  Goal status: INITIAL  6. Pt to demonstrate at least 4/5 pelvic floor strength and holds for at least 8s for improved pelvic stability and decreased strain at pelvic floor/ decrease leakage.  PLAN:  PT FREQUENCY: every other week  PT  DURATION:  6 sessions  PLANNED INTERVENTIONS: Therapeutic exercises, Therapeutic activity, Neuromuscular re-education, Balance training, Gait training, Patient/Family education, Self Care, Cryotherapy, Moist heat, scar mobilization, Taping, Biofeedback, and Manual therapy  PLAN FOR NEXT SESSION: coordination of pelvic floor and breathing with functional strengthening, review urge drill if needed, bladder irritants, pelvic floor strengthening   Otelia Sergeant, PT, DPT 05/08/249:46 AM

## 2022-06-28 ENCOUNTER — Encounter: Payer: Self-pay | Admitting: Physical Therapy

## 2022-06-28 DIAGNOSIS — L578 Other skin changes due to chronic exposure to nonionizing radiation: Secondary | ICD-10-CM | POA: Diagnosis not present

## 2022-06-28 DIAGNOSIS — D692 Other nonthrombocytopenic purpura: Secondary | ICD-10-CM | POA: Diagnosis not present

## 2022-06-28 DIAGNOSIS — L814 Other melanin hyperpigmentation: Secondary | ICD-10-CM | POA: Diagnosis not present

## 2022-06-28 DIAGNOSIS — L570 Actinic keratosis: Secondary | ICD-10-CM | POA: Diagnosis not present

## 2022-06-28 DIAGNOSIS — D225 Melanocytic nevi of trunk: Secondary | ICD-10-CM | POA: Diagnosis not present

## 2022-06-28 DIAGNOSIS — L821 Other seborrheic keratosis: Secondary | ICD-10-CM | POA: Diagnosis not present

## 2022-06-29 ENCOUNTER — Ambulatory Visit: Payer: Medicare HMO | Admitting: Physical Therapy

## 2022-07-21 DIAGNOSIS — S81811A Laceration without foreign body, right lower leg, initial encounter: Secondary | ICD-10-CM | POA: Diagnosis not present

## 2022-07-25 DIAGNOSIS — S81811A Laceration without foreign body, right lower leg, initial encounter: Secondary | ICD-10-CM | POA: Diagnosis not present

## 2022-08-08 ENCOUNTER — Ambulatory Visit: Payer: Medicare HMO | Admitting: Physical Therapy

## 2022-08-24 ENCOUNTER — Ambulatory Visit: Payer: Medicare HMO | Admitting: Physical Therapy

## 2022-08-31 DIAGNOSIS — F419 Anxiety disorder, unspecified: Secondary | ICD-10-CM | POA: Diagnosis not present

## 2022-08-31 DIAGNOSIS — J309 Allergic rhinitis, unspecified: Secondary | ICD-10-CM | POA: Diagnosis not present

## 2022-08-31 DIAGNOSIS — E782 Mixed hyperlipidemia: Secondary | ICD-10-CM | POA: Diagnosis not present

## 2022-08-31 DIAGNOSIS — Z23 Encounter for immunization: Secondary | ICD-10-CM | POA: Diagnosis not present

## 2022-08-31 DIAGNOSIS — E559 Vitamin D deficiency, unspecified: Secondary | ICD-10-CM | POA: Diagnosis not present

## 2022-10-21 DIAGNOSIS — H10403 Unspecified chronic conjunctivitis, bilateral: Secondary | ICD-10-CM | POA: Diagnosis not present

## 2022-10-28 DIAGNOSIS — Z008 Encounter for other general examination: Secondary | ICD-10-CM | POA: Diagnosis not present

## 2022-11-05 DIAGNOSIS — R051 Acute cough: Secondary | ICD-10-CM | POA: Diagnosis not present

## 2022-11-05 DIAGNOSIS — J069 Acute upper respiratory infection, unspecified: Secondary | ICD-10-CM | POA: Diagnosis not present

## 2022-11-05 DIAGNOSIS — R0981 Nasal congestion: Secondary | ICD-10-CM | POA: Diagnosis not present

## 2022-11-14 DIAGNOSIS — J329 Chronic sinusitis, unspecified: Secondary | ICD-10-CM | POA: Diagnosis not present

## 2022-12-05 ENCOUNTER — Encounter: Payer: Self-pay | Admitting: Family Medicine

## 2022-12-05 ENCOUNTER — Ambulatory Visit: Payer: Medicare HMO | Admitting: Family Medicine

## 2022-12-05 VITALS — BP 138/80 | Ht 68.0 in | Wt 185.0 lb

## 2022-12-05 DIAGNOSIS — M5441 Lumbago with sciatica, right side: Secondary | ICD-10-CM | POA: Diagnosis not present

## 2022-12-05 MED ORDER — CYCLOBENZAPRINE HCL 5 MG PO TABS: ORAL_TABLET | ORAL | 0 refills | Status: AC

## 2022-12-05 MED ORDER — PREDNISONE 10 MG PO TABS: ORAL_TABLET | ORAL | 0 refills | Status: AC

## 2022-12-05 NOTE — Patient Instructions (Signed)
Your right leg pain is due to sciatica (aka: piriformis syndrome) - I sent a prescription to the pharmacy for Prednisone to take as directed for the next 6 days to help with pain and inflammation.  You should take this with food.  Take your first 6 tabs when you pick up the medication today, then take it in the morning going forward - I sent a prescription for a muscle relaxant - cyclobenzaprine to take at bedtime.  This can make you sleepy/drowsy, so you should only take at bedtime - you should do the home exercises as instructed - you can apply heat to the area for 15-20 minutes several times a day as needed - you should follow-up with me in 6 weeks if no improvement or sooner as needed

## 2022-12-05 NOTE — Progress Notes (Signed)
DATE OF VISIT: 12/05/2022        April Silva DOB: 1955-11-29 MRN: [id number]  CC:  Rt leg pain  History- April Silva is a 67 y.o. female for evaluation and treatment of right leg pain Started about 2 weeks ago No specific injury/trauma, but was walking more also recalls doing some planting/yard work that may have aggravated her symptoms Pain in the right buttock radiating to the right ankle No numbness/tingling Worse at night when sleeping Worse with standing Tried placing block under her back, but made worse Taking Advil every day-bid prn  Using heating pad prn Denies lower ext weakness. Denies changes in bowel or bladder   Past Medical History Past Medical History:  Diagnosis Date   Blood transfusion without reported diagnosis age 2   lac artery and right sciatic nerve   Fibrocystic breast 10/98   Injury of right sciatic nerve 1971   w/laceration of artery and nerve, blood transfusion, fell through plate glass door   Kidney stone 11/10   stent, lithotripsy   Vitamin D deficiency     Past Surgical History Past Surgical History:  Procedure Laterality Date   BREAST BIOPSY Left    CESAREAN SECTION     X 3   LITHOTRIPSY  12/2008   multiple laceration  age 73   contusion and laceration of iliac artery , abdomen, right leg and elbow   ORIF FINGER FRACTURE Left 2013 ?    Medications Current Outpatient Medications  Medication Sig Dispense Refill   cyclobenzaprine (FLEXERIL) 5 MG tablet Take 1-2 tablets at bedtime as needed. 20 tablet 0   predniSONE (DELTASONE) 10 MG tablet Use as directed per doctors orders for the next 6 days. 21 tablet 0   atorvastatin (LIPITOR) 10 MG tablet Take 10 mg by mouth daily.     buPROPion (WELLBUTRIN SR) 150 MG 12 hr tablet Take 150 mg by mouth 2 (two) times daily.      cetirizine (ZYRTEC) 10 MG tablet Take 10 mg by mouth daily.     Lutein 20 MG CAPS      Melatonin-Pyridoxine (MELATONEX PO) Take 10 mg by mouth at bedtime.      montelukast (SINGULAIR) 10 MG tablet TAKE 1 TABLET EVERY DAY AS NEEDED FOR ALLERGIES OR ASTHMA  12   Vitamin D, Ergocalciferol, (DRISDOL) 1.25 MG (50000 UT) CAPS capsule 1 CAPSULE TWICE A WEEK ORALLY     No current facility-administered medications for this visit.    Allergies is allergic to demerol  [meperidine hcl].  Family History - reviewed per EMR and intake form  Social History   reports current alcohol use of about 5.0 standard drinks of alcohol per week.  reports that she has never smoked. She has never used smokeless tobacco.  reports no history of drug use. 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  No midline or paraspinal tenderness.  Negative SLR bilaterally.  Negative FABER bilaterally.  Able to toe walk and heel walk.  Lower extremity strength 5/5 bilaterally Hip: Bilateral hip with full range of motion without pain or weakness.  Negative FABER and FADIR bilaterally.  Normal hip strength bilaterally Gait: Normal gait, no leg length discrepancy  NEURO: sensation intact to light touch, DTR + 2/4 Achilles and patella bilaterally VASC: pulses 2+ and symmetric DP/PT bilaterally, no edema   Assessment & Plan Acute back pain with sciatica, right Acute right sided low back pain and buttock pain radiating into the right leg consistent with sciatica.  No associated red flags.  Likely related to recent yardwork/planting at home  Plan: Diagnosis and treatment discussed Rx prednisone taper x 6 days to take as directed.  Should take with food.  Reviewed common side effects and how to take medication Rx Flexeril 5 mg 1-2 tabs p.o. nightly as needed, advised as cause drowsiness, therefore should only take at  bedtime Discussed an exercise program, discussed home exercise program versus formal physical therapy.  She would like home exercise program.  HEP was provided Can continue use heat as needed Can use Advil 600 mg 3 times daily as needed with food after she completes the prednisone taper Red flag symptoms reviewed, she understands when to reach out or seek medical attention Follow-up 6 to 8 weeks if no improvement, would consider imaging and/or formal physical therapy.  Patient expressed understanding & agreement with above.  Encounter Diagnosis  Name Primary?   Acute back pain with sciatica, right Yes    No orders of the defined types were placed in this encounter.   No orders of the defined types were placed in this encounter.

## 2023-02-07 DIAGNOSIS — G5701 Lesion of sciatic nerve, right lower limb: Secondary | ICD-10-CM | POA: Diagnosis not present

## 2023-02-13 DIAGNOSIS — E782 Mixed hyperlipidemia: Secondary | ICD-10-CM | POA: Diagnosis not present

## 2023-02-13 DIAGNOSIS — E559 Vitamin D deficiency, unspecified: Secondary | ICD-10-CM | POA: Diagnosis not present

## 2023-02-15 DIAGNOSIS — F419 Anxiety disorder, unspecified: Secondary | ICD-10-CM | POA: Diagnosis not present

## 2023-02-15 DIAGNOSIS — M5431 Sciatica, right side: Secondary | ICD-10-CM | POA: Diagnosis not present

## 2023-02-15 DIAGNOSIS — F900 Attention-deficit hyperactivity disorder, predominantly inattentive type: Secondary | ICD-10-CM | POA: Diagnosis not present

## 2023-02-15 DIAGNOSIS — M5030 Other cervical disc degeneration, unspecified cervical region: Secondary | ICD-10-CM | POA: Diagnosis not present

## 2023-02-15 DIAGNOSIS — N200 Calculus of kidney: Secondary | ICD-10-CM | POA: Diagnosis not present

## 2023-02-15 DIAGNOSIS — J309 Allergic rhinitis, unspecified: Secondary | ICD-10-CM | POA: Diagnosis not present

## 2023-02-15 DIAGNOSIS — G4700 Insomnia, unspecified: Secondary | ICD-10-CM | POA: Diagnosis not present

## 2023-02-15 DIAGNOSIS — E782 Mixed hyperlipidemia: Secondary | ICD-10-CM | POA: Diagnosis not present

## 2023-02-15 DIAGNOSIS — H9193 Unspecified hearing loss, bilateral: Secondary | ICD-10-CM | POA: Diagnosis not present

## 2023-02-15 DIAGNOSIS — Z0000 Encounter for general adult medical examination without abnormal findings: Secondary | ICD-10-CM | POA: Diagnosis not present

## 2023-02-15 DIAGNOSIS — E559 Vitamin D deficiency, unspecified: Secondary | ICD-10-CM | POA: Diagnosis not present

## 2023-02-20 ENCOUNTER — Other Ambulatory Visit (HOSPITAL_COMMUNITY): Payer: Self-pay | Admitting: Internal Medicine

## 2023-02-20 DIAGNOSIS — I2510 Atherosclerotic heart disease of native coronary artery without angina pectoris: Secondary | ICD-10-CM

## 2023-03-02 ENCOUNTER — Ambulatory Visit (HOSPITAL_COMMUNITY)
Admission: RE | Admit: 2023-03-02 | Discharge: 2023-03-02 | Disposition: A | Discharge status: Home / Self Care | Payer: Self-pay | Source: Ambulatory Visit | Attending: Internal Medicine | Admitting: Internal Medicine

## 2023-03-02 DIAGNOSIS — I2510 Atherosclerotic heart disease of native coronary artery without angina pectoris: Secondary | ICD-10-CM | POA: Insufficient documentation

## 2023-03-10 ENCOUNTER — Other Ambulatory Visit: Payer: Self-pay | Admitting: Internal Medicine

## 2023-03-10 DIAGNOSIS — Z1231 Encounter for screening mammogram for malignant neoplasm of breast: Secondary | ICD-10-CM

## 2023-03-13 ENCOUNTER — Other Ambulatory Visit: Payer: Self-pay | Admitting: Internal Medicine

## 2023-03-13 DIAGNOSIS — M8580 Other specified disorders of bone density and structure, unspecified site: Secondary | ICD-10-CM

## 2023-04-14 DIAGNOSIS — H903 Sensorineural hearing loss, bilateral: Secondary | ICD-10-CM | POA: Diagnosis not present

## 2023-04-19 DIAGNOSIS — F430 Acute stress reaction: Secondary | ICD-10-CM | POA: Diagnosis not present

## 2023-05-08 ENCOUNTER — Ambulatory Visit
Admission: RE | Admit: 2023-05-08 | Discharge: 2023-05-08 | Disposition: A | Discharge status: Home / Self Care | Payer: Medicare HMO | Source: Ambulatory Visit | Attending: Internal Medicine

## 2023-05-08 DIAGNOSIS — Z1231 Encounter for screening mammogram for malignant neoplasm of breast: Secondary | ICD-10-CM | POA: Diagnosis not present

## 2023-05-10 DIAGNOSIS — H5203 Hypermetropia, bilateral: Secondary | ICD-10-CM | POA: Diagnosis not present

## 2023-05-10 DIAGNOSIS — Z0100 Encounter for examination of eyes and vision without abnormal findings: Secondary | ICD-10-CM | POA: Diagnosis not present

## 2023-06-06 DIAGNOSIS — Z1151 Encounter for screening for human papillomavirus (HPV): Secondary | ICD-10-CM | POA: Diagnosis not present

## 2023-06-06 DIAGNOSIS — Z124 Encounter for screening for malignant neoplasm of cervix: Secondary | ICD-10-CM | POA: Diagnosis not present

## 2023-06-20 DIAGNOSIS — Z1211 Encounter for screening for malignant neoplasm of colon: Secondary | ICD-10-CM | POA: Diagnosis not present

## 2023-06-20 DIAGNOSIS — Z860100 Personal history of colon polyps, unspecified: Secondary | ICD-10-CM | POA: Diagnosis not present

## 2023-06-20 DIAGNOSIS — K219 Gastro-esophageal reflux disease without esophagitis: Secondary | ICD-10-CM | POA: Diagnosis not present

## 2023-06-29 DIAGNOSIS — L578 Other skin changes due to chronic exposure to nonionizing radiation: Secondary | ICD-10-CM | POA: Diagnosis not present

## 2023-06-29 DIAGNOSIS — R234 Changes in skin texture: Secondary | ICD-10-CM | POA: Diagnosis not present

## 2023-06-29 DIAGNOSIS — L570 Actinic keratosis: Secondary | ICD-10-CM | POA: Diagnosis not present

## 2023-06-29 DIAGNOSIS — L821 Other seborrheic keratosis: Secondary | ICD-10-CM | POA: Diagnosis not present

## 2023-06-29 DIAGNOSIS — L814 Other melanin hyperpigmentation: Secondary | ICD-10-CM | POA: Diagnosis not present

## 2023-06-29 DIAGNOSIS — D225 Melanocytic nevi of trunk: Secondary | ICD-10-CM | POA: Diagnosis not present

## 2023-07-17 DIAGNOSIS — K648 Other hemorrhoids: Secondary | ICD-10-CM | POA: Diagnosis not present

## 2023-07-17 DIAGNOSIS — K635 Polyp of colon: Secondary | ICD-10-CM | POA: Diagnosis not present

## 2023-07-17 DIAGNOSIS — Z860100 Personal history of colon polyps, unspecified: Secondary | ICD-10-CM | POA: Diagnosis not present

## 2023-07-17 DIAGNOSIS — D122 Benign neoplasm of ascending colon: Secondary | ICD-10-CM | POA: Diagnosis not present

## 2023-07-17 DIAGNOSIS — Z860101 Personal history of adenomatous and serrated colon polyps: Secondary | ICD-10-CM | POA: Diagnosis not present

## 2023-07-17 DIAGNOSIS — Z1211 Encounter for screening for malignant neoplasm of colon: Secondary | ICD-10-CM | POA: Diagnosis not present

## 2023-08-07 DIAGNOSIS — E782 Mixed hyperlipidemia: Secondary | ICD-10-CM | POA: Diagnosis not present

## 2023-08-07 DIAGNOSIS — E669 Obesity, unspecified: Secondary | ICD-10-CM | POA: Diagnosis not present

## 2023-08-10 DIAGNOSIS — H0015 Chalazion left lower eyelid: Secondary | ICD-10-CM | POA: Diagnosis not present

## 2023-09-07 DIAGNOSIS — E782 Mixed hyperlipidemia: Secondary | ICD-10-CM | POA: Diagnosis not present

## 2023-09-07 DIAGNOSIS — E669 Obesity, unspecified: Secondary | ICD-10-CM | POA: Diagnosis not present

## 2023-09-12 DIAGNOSIS — E782 Mixed hyperlipidemia: Secondary | ICD-10-CM | POA: Diagnosis not present

## 2023-10-08 DIAGNOSIS — J029 Acute pharyngitis, unspecified: Secondary | ICD-10-CM | POA: Diagnosis not present

## 2023-10-08 DIAGNOSIS — R519 Headache, unspecified: Secondary | ICD-10-CM | POA: Diagnosis not present

## 2023-10-08 DIAGNOSIS — E782 Mixed hyperlipidemia: Secondary | ICD-10-CM | POA: Diagnosis not present

## 2023-10-08 DIAGNOSIS — E669 Obesity, unspecified: Secondary | ICD-10-CM | POA: Diagnosis not present

## 2023-10-08 DIAGNOSIS — Z20822 Contact with and (suspected) exposure to covid-19: Secondary | ICD-10-CM | POA: Diagnosis not present

## 2023-11-06 DIAGNOSIS — Z008 Encounter for other general examination: Secondary | ICD-10-CM | POA: Diagnosis not present

## 2023-11-07 DIAGNOSIS — E782 Mixed hyperlipidemia: Secondary | ICD-10-CM | POA: Diagnosis not present

## 2023-11-07 DIAGNOSIS — E669 Obesity, unspecified: Secondary | ICD-10-CM | POA: Diagnosis not present

## 2023-11-13 ENCOUNTER — Other Ambulatory Visit: Payer: Medicare HMO

## 2023-11-28 DIAGNOSIS — L239 Allergic contact dermatitis, unspecified cause: Secondary | ICD-10-CM | POA: Diagnosis not present

## 2023-12-08 DIAGNOSIS — E782 Mixed hyperlipidemia: Secondary | ICD-10-CM | POA: Diagnosis not present

## 2023-12-08 DIAGNOSIS — E669 Obesity, unspecified: Secondary | ICD-10-CM | POA: Diagnosis not present

## 2024-01-07 DIAGNOSIS — E669 Obesity, unspecified: Secondary | ICD-10-CM | POA: Diagnosis not present

## 2024-01-07 DIAGNOSIS — E782 Mixed hyperlipidemia: Secondary | ICD-10-CM | POA: Diagnosis not present
